# Patient Record
Sex: Female | Born: 1945 | Race: White | Hispanic: No | Marital: Married | Smoking: Never smoker
Health system: Southern US, Community
[De-identification: ages and names within clinical notes are randomized; demographics above are authoritative.]

## PROBLEM LIST (undated history)

## (undated) DIAGNOSIS — E785 Hyperlipidemia, unspecified: Secondary | ICD-10-CM

## (undated) DIAGNOSIS — C801 Malignant (primary) neoplasm, unspecified: Secondary | ICD-10-CM

## (undated) DIAGNOSIS — K219 Gastro-esophageal reflux disease without esophagitis: Secondary | ICD-10-CM

## (undated) HISTORY — DX: Gastro-esophageal reflux disease without esophagitis: K21.9

## (undated) HISTORY — DX: Hyperlipidemia, unspecified: E78.5

## (undated) HISTORY — PX: COLONOSCOPY: SHX174

## (undated) HISTORY — DX: Malignant (primary) neoplasm, unspecified: C80.1

---

## 1975-04-15 HISTORY — PX: KIDNEY SURGERY: SHX687

## 1988-04-14 HISTORY — PX: ABDOMINAL HYSTERECTOMY: SHX81

## 1997-08-17 ENCOUNTER — Other Ambulatory Visit: Admission: RE | Admit: 1997-08-17 | Discharge: 1997-08-17 | Payer: Self-pay | Admitting: Obstetrics and Gynecology

## 2001-01-11 ENCOUNTER — Ambulatory Visit (HOSPITAL_COMMUNITY): Admission: RE | Admit: 2001-01-11 | Discharge: 2001-01-11 | Payer: Self-pay | Admitting: *Deleted

## 2004-02-12 ENCOUNTER — Other Ambulatory Visit: Admission: RE | Admit: 2004-02-12 | Discharge: 2004-02-12 | Payer: Self-pay | Admitting: Obstetrics and Gynecology

## 2005-03-03 ENCOUNTER — Other Ambulatory Visit: Admission: RE | Admit: 2005-03-03 | Discharge: 2005-03-03 | Payer: Self-pay | Admitting: Obstetrics and Gynecology

## 2006-05-04 ENCOUNTER — Other Ambulatory Visit: Admission: RE | Admit: 2006-05-04 | Discharge: 2006-05-04 | Payer: Self-pay | Admitting: Obstetrics and Gynecology

## 2007-05-10 ENCOUNTER — Other Ambulatory Visit: Admission: RE | Admit: 2007-05-10 | Discharge: 2007-05-10 | Payer: Self-pay | Admitting: Obstetrics and Gynecology

## 2008-05-11 ENCOUNTER — Other Ambulatory Visit: Admission: RE | Admit: 2008-05-11 | Discharge: 2008-05-11 | Payer: Self-pay | Admitting: Obstetrics and Gynecology

## 2009-07-18 ENCOUNTER — Other Ambulatory Visit: Admission: RE | Admit: 2009-07-18 | Discharge: 2009-07-18 | Payer: Self-pay | Admitting: Obstetrics and Gynecology

## 2012-11-12 DIAGNOSIS — C801 Malignant (primary) neoplasm, unspecified: Secondary | ICD-10-CM

## 2012-11-12 HISTORY — DX: Malignant (primary) neoplasm, unspecified: C80.1

## 2012-11-12 HISTORY — PX: MELANOMA EXCISION: SHX5266

## 2012-11-22 ENCOUNTER — Other Ambulatory Visit: Payer: Self-pay | Admitting: Dermatology

## 2012-11-23 ENCOUNTER — Other Ambulatory Visit: Payer: Self-pay | Admitting: Dermatology

## 2013-05-05 ENCOUNTER — Ambulatory Visit (INDEPENDENT_AMBULATORY_CARE_PROVIDER_SITE_OTHER): Payer: Medicare Other | Admitting: Nurse Practitioner

## 2013-05-05 ENCOUNTER — Encounter: Payer: Self-pay | Admitting: Nurse Practitioner

## 2013-05-05 VITALS — BP 116/64 | HR 64 | Ht 62.25 in | Wt 146.0 lb

## 2013-05-05 DIAGNOSIS — Z01419 Encounter for gynecological examination (general) (routine) without abnormal findings: Secondary | ICD-10-CM

## 2013-05-05 DIAGNOSIS — Z0000 Encounter for general adult medical examination without abnormal findings: Secondary | ICD-10-CM

## 2013-05-05 LAB — POCT URINALYSIS DIPSTICK
BILIRUBIN UA: NEGATIVE
Blood, UA: NEGATIVE
GLUCOSE UA: NEGATIVE
Ketones, UA: NEGATIVE
Leukocytes, UA: NEGATIVE
NITRITE UA: NEGATIVE
Protein, UA: NEGATIVE
UROBILINOGEN UA: NEGATIVE
pH, UA: 6

## 2013-05-05 NOTE — Patient Instructions (Addendum)

## 2013-05-05 NOTE — Progress Notes (Signed)
Patient ID: Linda Peters, female   DOB: 1946-04-03, 68 y.o.   MRN: [id number] 68 y.o. G78P1001 Married Caucasian Fe here for annual exam.    No LMP recorded. Patient has had a hysterectomy.          Sexually active: no  The current method of family planning is post menopausal status.    Exercising: yes  walking, but not regularly Smoker:  no  Health Maintenance: Pap:  TAH 1990 MMG:  05/18/12, normal Colonoscopy:9/30/ 2002 normal recheck in 10 years BMD:  05/2011, -1.0/-0.8 TDaP:  05/01/11 Shingles vaccine 2011 Pneumovax 2014 Labs:  HB:  PCP Urine: negative   reports that she has never smoked. She has never used smokeless tobacco. She reports that she does not drink alcohol or use illicit drugs.  Past Medical History  Diagnosis Date  . Cancer 11/2012    melanoma, face  . Hyperlipidemia     Past Surgical History  Procedure Laterality Date  . Melanoma excision Right 11/2012    right side of face  . Kidney surgery Right 1977  . Abdominal hysterectomy  1990    secondary to fibroids, ovaries remain    Current Outpatient Prescriptions  Medication Sig Dispense Refill  . Calcium Carbonate (CALCIUM 600 PO) Take 1 tablet by mouth 2 (two) times daily.      . Multiple Vitamin (MULTIVITAMIN) tablet Take 1 tablet by mouth daily.      . simvastatin (ZOCOR) 40 MG tablet Take 40 mg by mouth daily.      . TRANSDERM-SCOP 1.5 MG as directed.      . vitamin D, CHOLECALCIFEROL, 400 UNITS tablet Take 400 Units by mouth daily.       No current facility-administered medications for this visit.    Family History  Problem Relation Age of Onset  . Dementia Mother   . Hyperlipidemia Sister   . Heart failure Maternal Grandmother   . Hyperlipidemia Sister     ROS:  Pertinent items are noted in HPI.  Otherwise, a comprehensive ROS was negative.  Exam:   BP 116/64  Pulse 64  Ht 5' 2.25" (1.581 m)  Wt 146 lb (66.225 kg)  BMI 26.49 kg/m2 Height: 5' 2.25" (158.1 cm)  Ht Readings from Last 3  Encounters:  05/05/13 5' 2.25" (1.581 m)    General appearance: alert, cooperative and appears stated age Head: Normocephalic, without obvious abnormality, atraumatic Neck: no adenopathy, supple, symmetrical, trachea midline and thyroid normal to inspection and palpation Lungs: clear to auscultation bilaterally Breasts: normal appearance, no masses or tenderness Heart: regular rate and rhythm Abdomen: soft, non-tender; no masses,  no organomegaly Extremities: extremities normal, atraumatic, no cyanosis or edema Skin: Skin color, texture, turgor normal. No rashes or lesions Lymph nodes: Cervical, supraclavicular, and axillary nodes normal. 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

## 2013-05-06 NOTE — Progress Notes (Signed)
Encounter reviewed by Dr. Terre Zabriskie Silva.  

## 2013-05-16 ENCOUNTER — Other Ambulatory Visit: Payer: Self-pay | Admitting: Dermatology

## 2014-02-13 ENCOUNTER — Encounter: Payer: Self-pay | Admitting: Nurse Practitioner

## 2014-05-08 ENCOUNTER — Ambulatory Visit: Payer: Medicare Other | Admitting: Nurse Practitioner

## 2014-07-11 ENCOUNTER — Ambulatory Visit (INDEPENDENT_AMBULATORY_CARE_PROVIDER_SITE_OTHER): Payer: Medicare PPO | Admitting: Nurse Practitioner

## 2014-07-11 ENCOUNTER — Encounter: Payer: Self-pay | Admitting: Nurse Practitioner

## 2014-07-11 VITALS — BP 120/84 | HR 68 | Ht 62.0 in | Wt 145.0 lb

## 2014-07-11 DIAGNOSIS — Z1211 Encounter for screening for malignant neoplasm of colon: Secondary | ICD-10-CM

## 2014-07-11 DIAGNOSIS — Z0000 Encounter for general adult medical examination without abnormal findings: Secondary | ICD-10-CM | POA: Diagnosis not present

## 2014-07-11 DIAGNOSIS — Z01419 Encounter for gynecological examination (general) (routine) without abnormal findings: Secondary | ICD-10-CM

## 2014-07-11 LAB — POCT URINALYSIS DIPSTICK
Bilirubin, UA: NEGATIVE
Glucose, UA: NEGATIVE
Ketones, UA: NEGATIVE
Leukocytes, UA: NEGATIVE
Nitrite, UA: NEGATIVE
PH UA: 7
PROTEIN UA: NEGATIVE
RBC UA: NEGATIVE
UROBILINOGEN UA: NEGATIVE

## 2014-07-11 NOTE — Progress Notes (Signed)
Patient ID: Linda Peters, female   DOB: 1945-11-28, 69 y.o.   MRN: [id number] 69 y.o. G92P1001 Married  Caucasian Fe here for annual exam.  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  She is getting Hep B through that facility.  Patient's last menstrual period was 04/14/1988.          Sexually active: No.  The current method of family planning is none.    Exercising: Yes.    patient goes to gym and walks Smoker:  no  Health Maintenance: Pap:  TAH 1990 MMG:  05/20/13, Bi-Rads 1:  Negative [facility]) Colonoscopy:  01/11/01, repeat in 10 years  BMD:   05/2011, T-Score -1.0/-0.8 TDaP:  05/01/11 Shingles: 2011 Prevnar 13: ? Labs:  HB:  PCP  Urine:  Negative    reports that she has never smoked. She has never used smokeless tobacco. She reports that she does not drink alcohol or use illicit drugs.  Past Medical History  Diagnosis Date  . Cancer 11/2012    melanoma, face  . Hyperlipidemia     Past Surgical History  Procedure Laterality Date  . Melanoma excision Right 11/2012    right side of face  . Kidney surgery Right 1977  . Abdominal hysterectomy  1990    secondary to endometrial hyperplasia & fibroids, ovaries remain    Current Outpatient Prescriptions  Medication Sig Dispense Refill  . Calcium Carbonate (CALCIUM 600 PO) Take 1 tablet by mouth 2 (two) times daily.    . Multiple Vitamin (MULTIVITAMIN) tablet Take 1 tablet by mouth daily.    . simvastatin (ZOCOR) 40 MG tablet Take 40 mg by mouth daily.    . vitamin D, CHOLECALCIFEROL, 400 UNITS tablet Take 400 Units by mouth daily.     No current facility-administered medications for this visit.    Family History  Problem Relation Age of Onset  . Dementia Mother   . Hyperlipidemia Sister   . Heart failure Maternal Grandmother   . Hyperlipidemia Sister     ROS:  Pertinent items are noted in HPI.  Otherwise, a comprehensive ROS was negative.  Exam:   BP 120/84 mmHg  Pulse 68  Ht 5\' 2"  (1.575 m)   Wt 145 lb (65.772 kg)  BMI 26.51 kg/m2  LMP 04/14/1988 Height: 5\' 2"  (157.5 cm) Ht Readings from Last 3 Encounters:  07/11/14 5\' 2"  (1.575 m)  05/05/13 5' 2.25" (1.581 m)    General appearance: alert, cooperative and appears stated age Head: Normocephalic, without obvious abnormality, atraumatic Neck: no adenopathy, supple, symmetrical, trachea midline and thyroid normal to inspection and palpation Lungs: clear to auscultation bilaterally Breasts: normal appearance, no masses or tenderness Heart: regular rate and rhythm Abdomen: soft, non-tender; no masses,  no organomegaly Extremities: extremities normal, atraumatic, no cyanosis or edema Skin: Skin color, texture, turgor normal. No rashes or lesions Lymph nodes: Cervical, supraclavicular, and axillary nodes normal. No abnormal inguinal nodes palpated Neurologic: Grossly normal   Pelvic: External genitalia:  no lesions              Urethra:  normal appearing urethra with no masses, tenderness or lesions              Bartholin's and Skene's: normal                 Vagina: normal appearing vagina with normal color and discharge, no lesions              Cervix:  absent              Pap taken: No. 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

## 2014-07-11 NOTE — Patient Instructions (Addendum)

## 2014-07-12 NOTE — Progress Notes (Signed)
Encounter reviewed by Dr. Dellene Mcgroarty Silva.  

## 2014-08-14 LAB — FECAL OCCULT BLOOD, IMMUNOCHEMICAL: IMMUNOLOGICAL FECAL OCCULT BLOOD TEST: NEGATIVE

## 2014-08-14 NOTE — Addendum Note (Signed)
Addended by: Susy Manor on: 08/14/2014 01:00 PM   Modules accepted: Orders

## 2014-10-03 ENCOUNTER — Encounter: Payer: Self-pay | Admitting: Emergency Medicine

## 2014-10-03 ENCOUNTER — Telehealth: Payer: Self-pay | Admitting: Emergency Medicine

## 2014-10-03 NOTE — Telephone Encounter (Signed)
Call to patient. She has not made appointment for Gastroenterology at [place]. Patient was contacted x 3 and also a letter was mailed to her home address.  Spoke with patient. She states she has been "putting this off" and expresses that she does not wish to schedule at this time.  Advised that [facility] has her information for when she would like to schedule when she is ready to do so. Patient also agrees that she has their contact information and will call them when she is ready.   Routing to provider for update and will close encounter.

## 2014-10-09 ENCOUNTER — Other Ambulatory Visit: Payer: Self-pay

## 2014-10-26 ENCOUNTER — Encounter: Payer: Self-pay | Admitting: *Deleted

## 2014-10-26 ENCOUNTER — Telehealth: Payer: Self-pay | Admitting: Nurse Practitioner

## 2014-10-26 NOTE — Telephone Encounter (Signed)
Letter to your office for review. 

## 2014-10-26 NOTE — Telephone Encounter (Signed)
Patient says she need a referral for a colonoscopy faxed to [facility] in [place]. Fax number is [phone].

## 2014-10-27 NOTE — Telephone Encounter (Signed)
Left message to call Rai Severns at [phone]. 

## 2014-10-27 NOTE — Telephone Encounter (Signed)
Spoke with patient. Notified patient referral letter was written and signed by Milford Cage, [place]. Has been faxed with cover sheet and confirmation to [facility] in [place] at [phone]. Patient is agreeable.  Routing to provider for final review. Patient agreeable to disposition. Will close encounter.

## 2014-10-30 ENCOUNTER — Telehealth: Payer: Self-pay | Admitting: Nurse Practitioner

## 2014-10-30 NOTE — Telephone Encounter (Signed)
Spoke with Linda Peters who states that patient will need referral from her PCP with her insurance. Spoke with patient advised referral will need to come from her PCP. Patient is agreeable and will call her PCP for referral to schedule appointment. Will let us know if she needs anything further.  Routing to provider for final review. Patient agreeable to disposition. Will close encounter.   Patient aware provider will review message and nurse will return call if any additional advice or change of disposition.

## 2014-10-30 NOTE — Telephone Encounter (Signed)
Patient calling to check on status of referral for a colonoscopy. She says the office did not receive the referral.  Facility in [place]: Fax: [phone], Phone: [phone]

## 2015-01-18 LAB — HM COLONOSCOPY

## 2015-07-13 ENCOUNTER — Ambulatory Visit: Payer: Medicare PPO | Admitting: Nurse Practitioner

## 2015-07-17 ENCOUNTER — Ambulatory Visit (INDEPENDENT_AMBULATORY_CARE_PROVIDER_SITE_OTHER): Payer: Medicare Other | Admitting: Nurse Practitioner

## 2015-07-17 ENCOUNTER — Encounter: Payer: Self-pay | Admitting: Nurse Practitioner

## 2015-07-17 VITALS — BP 110/74 | HR 64 | Ht 62.25 in | Wt 144.0 lb

## 2015-07-17 DIAGNOSIS — E559 Vitamin D deficiency, unspecified: Secondary | ICD-10-CM | POA: Diagnosis not present

## 2015-07-17 DIAGNOSIS — Z803 Family history of malignant neoplasm of breast: Secondary | ICD-10-CM | POA: Diagnosis not present

## 2015-07-17 DIAGNOSIS — Z0000 Encounter for general adult medical examination without abnormal findings: Secondary | ICD-10-CM | POA: Diagnosis not present

## 2015-07-17 DIAGNOSIS — Z01419 Encounter for gynecological examination (general) (routine) without abnormal findings: Secondary | ICD-10-CM

## 2015-07-17 LAB — POCT URINALYSIS DIPSTICK
Bilirubin, UA: NEGATIVE
Blood, UA: NEGATIVE
GLUCOSE UA: NEGATIVE
KETONES UA: NEGATIVE
Leukocytes, UA: NEGATIVE
Nitrite, UA: NEGATIVE
PROTEIN UA: NEGATIVE
Urobilinogen, UA: NEGATIVE
pH, UA: 7

## 2015-07-17 NOTE — Progress Notes (Signed)
Patient ID: Linda Peters, female   DOB: Jul 29, 1945, 70 y.o.   MRN: [id number]  70 y.o. G61P1001 Married Caucasian Fe here for annual exam.  No new diagnosis or surgery. Sister age 85 now diagnosed with breast cancer treated with lumpectomy X 4 areas -   Stage 0.  Will get radiation treatment.  Will check on sisters BRCA status.  She continues working with Hospice.     Patient's last menstrual period was 04/14/1988 (approximate).          Sexually active: Yes.    The current method of family planning is status post hysterectomy.    Exercising: Yes.    Gym/ health club routine includes exercise class 2-3 days per week. Smoker:  no  Health Maintenance: Pap: TAH 1990 MMG:07/13/14, Bi-Rads 1: Negative [facility]) Colonoscopy: 01/18/15, polyp (non-cancerous per letter), repeat in 5 years BMD:07/26/14 T Score; -1.4 Spine / -0.7 Left Femur Neck TDaP: 05/01/11 Shingles: 2011 Prevnar 13: 07/2014, Pneumovax 2015 Hep C: done today Labs: Dr. Unk Lightning  Urine: Negative    reports that she has never smoked. She has never used smokeless tobacco. She reports that she does not drink alcohol or use illicit drugs.  Past Medical History  Diagnosis Date  . Cancer ([place]) 11/2012    melanoma, face  . Hyperlipidemia     Past Surgical History  Procedure Laterality Date  . Melanoma excision Right 11/2012    right side of face  . Kidney surgery Right 1977  . Abdominal hysterectomy  1990    secondary to endometrial hyperplasia & fibroids, ovaries remain    Current Outpatient Prescriptions  Medication Sig Dispense Refill  . Calcium Carbonate (CALCIUM 600 PO) Take 1 tablet by mouth 2 (two) times daily.    . Multiple Vitamin (MULTIVITAMIN) tablet Take 1 tablet by mouth daily.    . simvastatin (ZOCOR) 40 MG tablet Take 40 mg by mouth daily.    . vitamin D, CHOLECALCIFEROL, 400 UNITS tablet Take 400 Units by mouth daily.     No current facility-administered medications for this visit.    Family  History  Problem Relation Age of Onset  . Dementia Mother   . Hyperlipidemia Sister   . Breast cancer Sister 34    lumpectomy and radiation, left  . Heart failure Maternal Grandmother   . Hyperlipidemia Sister   . Pancreatic cancer Cousin 80    surgrey 2017 - still living    ROS:  Pertinent items are noted in HPI.  Otherwise, a comprehensive ROS was negative.  Exam:   BP 110/74 mmHg  Pulse 64  Ht 5' 2.25" (1.581 m)  Wt 144 lb (65.318 kg)  BMI 26.13 kg/m2  LMP 04/14/1988 (Approximate) Height: 5' 2.25" (158.1 cm) Ht Readings from Last 3 Encounters:  07/17/15 5' 2.25" (1.581 m)  07/11/14 _0  (1.575 m)  05/05/13 5' 2.25" (1.581 m)    General appearance: alert, cooperative and appears stated age Head: Normocephalic, without obvious abnormality, atraumatic Neck: no adenopathy, supple, symmetrical, trachea midline and thyroid normal to inspection and palpation Lungs: clear to auscultation bilaterally Breasts: normal appearance, no masses or tenderness Heart: regular rate and rhythm Abdomen: soft, non-tender; no masses,  no organomegaly Extremities: extremities normal, atraumatic, no cyanosis or edema Skin: Skin color, texture, turgor normal. No rashes or lesions Lymph nodes: Cervical, supraclavicular, and axillary nodes normal. No abnormal inguinal nodes palpated Neurologic: Grossly normal   Pelvic: External genitalia:  no lesions  Urethra:  normal appearing urethra with no masses, tenderness or lesions              Bartholin's and Skene's: normal                 Vagina: normal appearing vagina with normal color and discharge, no lesions              Cervix: absent              Pap taken: No. Bimanual Exam:  Uterus:  uterus absent              Adnexa: no mass, fullness, tenderness               Rectovaginal: Confirms               Anus:  normal sphincter tone, no lesions  Chaperone present: no  A:  Well Woman with normal exam  S/P TAH secondary to  endometrial hyperplasia & fibroids 1990 no HRT Hypercholesterolemia  Vit D deficiency   P:   Reviewed health and wellness pertinent to exam  Pap smear as above  Mammogram is due now and [facility] diagnosis.    She is given information about BRCA and testing  Will follow with labs  Counseled on breast self exam, mammography screening, adequate intake of calcium and vitamin D, diet and exercise, Kegel's exercises return annually or prn  An After Visit Summary was printed and given to the patient.

## 2015-07-17 NOTE — Patient Instructions (Addendum)

## 2015-07-18 LAB — HEPATITIS C ANTIBODY: HCV AB: NEGATIVE

## 2015-07-18 LAB — VITAMIN D 25 HYDROXY (VIT D DEFICIENCY, FRACTURES): VIT D 25 HYDROXY: 50 ng/mL (ref 30–100)

## 2015-07-22 NOTE — Progress Notes (Signed)
Reviewed personally.  M. Suzanne Kynnedi Zweig, MD.  

## 2015-07-23 ENCOUNTER — Other Ambulatory Visit: Payer: Self-pay

## 2015-07-23 DIAGNOSIS — Z1231 Encounter for screening mammogram for malignant neoplasm of breast: Secondary | ICD-10-CM

## 2015-08-07 ENCOUNTER — Ambulatory Visit
Admission: RE | Admit: 2015-08-07 | Discharge: 2015-08-07 | Disposition: A | Discharge status: Home / Self Care | Payer: Medicare Other | Source: Ambulatory Visit

## 2015-08-07 DIAGNOSIS — Z1231 Encounter for screening mammogram for malignant neoplasm of breast: Secondary | ICD-10-CM

## 2015-08-10 ENCOUNTER — Other Ambulatory Visit: Payer: Self-pay | Admitting: Nurse Practitioner

## 2015-08-10 DIAGNOSIS — R928 Other abnormal and inconclusive findings on diagnostic imaging of breast: Secondary | ICD-10-CM

## 2015-08-17 ENCOUNTER — Ambulatory Visit
Admission: RE | Admit: 2015-08-17 | Discharge: 2015-08-17 | Disposition: A | Discharge status: Home / Self Care | Payer: Medicare Other | Source: Ambulatory Visit | Attending: Nurse Practitioner | Admitting: Nurse Practitioner

## 2015-08-17 DIAGNOSIS — R928 Other abnormal and inconclusive findings on diagnostic imaging of breast: Secondary | ICD-10-CM

## 2016-07-22 ENCOUNTER — Ambulatory Visit (INDEPENDENT_AMBULATORY_CARE_PROVIDER_SITE_OTHER): Payer: Medicare Other | Admitting: Nurse Practitioner

## 2016-07-22 ENCOUNTER — Encounter: Payer: Self-pay | Admitting: Nurse Practitioner

## 2016-07-22 VITALS — BP 120/78 | HR 64 | Ht 61.75 in | Wt 145.0 lb

## 2016-07-22 DIAGNOSIS — Z0000 Encounter for general adult medical examination without abnormal findings: Secondary | ICD-10-CM

## 2016-07-22 DIAGNOSIS — Z01411 Encounter for gynecological examination (general) (routine) with abnormal findings: Secondary | ICD-10-CM | POA: Diagnosis not present

## 2016-07-22 DIAGNOSIS — Z803 Family history of malignant neoplasm of breast: Secondary | ICD-10-CM

## 2016-07-22 DIAGNOSIS — E2839 Other primary ovarian failure: Secondary | ICD-10-CM

## 2016-07-22 DIAGNOSIS — E559 Vitamin D deficiency, unspecified: Secondary | ICD-10-CM

## 2016-07-22 NOTE — Progress Notes (Signed)
Patient ID: Linda Peters, female   DOB: April 28, 1945, 71 y.o.   MRN: [id number]  71 y.o. G7P1001 Married  Caucasian Fe here for annual exam.  Now not substituting.  No new problems since last year.  Patient's last menstrual period was 04/14/1988 (approximate).          Sexually active: No.  The current method of family planning is abstinence and status post hysterectomy.    Exercising: Yes.    Gym/ health club routine includes aerobics 2-3 mornings per week and walking. Smoker:  no  Health Maintenance: Pap: TAH 1990 MMG:08/07/15, 3D with additional views of left breast 08/17/15; Bi-Rads 1:  Negative Colonoscopy:01/18/15, polyp (non-cancerous per letter), repeat in 5 years ? Will check with GI Dr. Lyndel Safe. BMD:07/26/14 T Score; -1.4 Spine / -0.7 Left Femur Neck (right hip not measured) TDaP: 05/01/11 Shingles: 2011 Pneumonia: Prevnar 13: 07/2014, Pneumovax 2015 Hep C: 07/17/15 Labs: PCP is taking care of all labs   reports that she has never smoked. She has never used smokeless tobacco. She reports that she does not drink alcohol or use drugs.  Past Medical History:  Diagnosis Date  . Cancer ([place]) 11/2012   melanoma, face  . Hyperlipidemia     Past Surgical History:  Procedure Laterality Date  . ABDOMINAL HYSTERECTOMY  1990   secondary to endometrial hyperplasia & fibroids, ovaries remain  . KIDNEY SURGERY Right 1977  . MELANOMA EXCISION Right 11/2012   right side of face    Current Outpatient Prescriptions  Medication Sig Dispense Refill  . Calcium Carbonate (CALCIUM 600 PO) Take 1 tablet by mouth 2 (two) times daily.    . Multiple Vitamin (MULTIVITAMIN) tablet Take 1 tablet by mouth daily.    . simvastatin (ZOCOR) 20 MG tablet Take 20 mg by mouth daily.    . vitamin D, CHOLECALCIFEROL, 400 UNITS tablet Take 400 Units by mouth daily.     No current facility-administered medications for this visit.     Family History  Problem Relation Age of Onset  . Dementia Mother   .  Hyperlipidemia Sister   . Breast cancer Sister 62    .left lumpectomy with radiaton, BRCA neg.  Marland Kitchen Heart failure Maternal Grandmother   . Hyperlipidemia Sister   . Pancreatic cancer Cousin 80    surgrey 2017 - still living    ROS:  Pertinent items are noted in HPI.  Otherwise, a comprehensive ROS was negative.  Exam:   BP 120/78 (BP Location: Right Arm, Patient Position: Sitting, Cuff Size: Normal)   Pulse 64   Ht 5' 1.75" (1.568 m)   Wt 145 lb (65.8 kg)   LMP 04/14/1988 (Approximate)   BMI 26.74 kg/m  Height: 5' 1.75" (156.8 cm) Ht Readings from Last 3 Encounters:  07/22/16 5' 1.75" (1.568 m)  07/17/15 5' 2.25" (1.581 m)  07/11/14 '5\' 2"'  (1.575 m)    General appearance: alert, cooperative and appears stated age Head: Normocephalic, without obvious abnormality, atraumatic Neck: no adenopathy, supple, symmetrical, trachea midline and thyroid normal to inspection and palpation Lungs: clear to auscultation bilaterally Breasts: normal appearance, no masses or tenderness Heart: regular rate and rhythm Abdomen: soft, non-tender; no masses,  no organomegaly Extremities: extremities normal, atraumatic, no cyanosis or edema Skin: Skin color, texture, turgor normal. No rashes or lesions Lymph nodes: Cervical, supraclavicular, and axillary nodes normal. No abnormal inguinal nodes palpated Neurologic: Grossly normal   Pelvic: External genitalia:  no lesions  Urethra:  normal appearing urethra with no masses, tenderness or lesions              Bartholin's and Skene's: normal                 Vagina: normal appearing vagina with normal color and discharge, no lesions              Cervix: absent              Pap taken: No. Bimanual Exam:  Uterus:  uterus absent              Adnexa: no mass, fullness, tenderness               Rectovaginal: Confirms               Anus:  normal sphincter tone, no lesions  Chaperone present: yes  A:  Well Woman with normal exam   S/P TAH  secondary to endometrial hyperplasia & fibroids 1990 no HRT Hypercholesterolemia             Vit D deficiency    P:   Reviewed health and wellness pertinent to exam  Pap smear as above  Mammogram is due 07/2016 and will get BMD - order is placed   Follow with Vit D - currently on OTC  Counseled on breast self exam, mammography screening, adequate intake of calcium and vitamin D, diet and exercise return annually or prn  An After Visit Summary was printed and given to the patient.

## 2016-07-22 NOTE — Patient Instructions (Signed)

## 2016-07-23 ENCOUNTER — Other Ambulatory Visit: Payer: Self-pay | Admitting: Nurse Practitioner

## 2016-07-23 DIAGNOSIS — Z1231 Encounter for screening mammogram for malignant neoplasm of breast: Secondary | ICD-10-CM

## 2016-07-23 LAB — VITAMIN D 25 HYDROXY (VIT D DEFICIENCY, FRACTURES): Vit D, 25-Hydroxy: 43 ng/mL (ref 30–100)

## 2016-07-24 NOTE — Progress Notes (Signed)
Encounter reviewed by Dr. Jaklyn Alen Amundson C. Silva.  

## 2016-08-15 ENCOUNTER — Ambulatory Visit
Admission: RE | Admit: 2016-08-15 | Discharge: 2016-08-15 | Disposition: A | Discharge status: Home / Self Care | Payer: Medicare Other | Source: Ambulatory Visit | Attending: Nurse Practitioner | Admitting: Nurse Practitioner

## 2016-08-15 DIAGNOSIS — E2839 Other primary ovarian failure: Secondary | ICD-10-CM

## 2016-08-15 DIAGNOSIS — Z1231 Encounter for screening mammogram for malignant neoplasm of breast: Secondary | ICD-10-CM

## 2016-08-18 ENCOUNTER — Other Ambulatory Visit: Payer: Self-pay | Admitting: Nurse Practitioner

## 2016-08-18 DIAGNOSIS — R928 Other abnormal and inconclusive findings on diagnostic imaging of breast: Secondary | ICD-10-CM

## 2016-08-21 ENCOUNTER — Ambulatory Visit
Admission: RE | Admit: 2016-08-21 | Discharge: 2016-08-21 | Disposition: A | Discharge status: Home / Self Care | Payer: Medicare Other | Source: Ambulatory Visit | Attending: Nurse Practitioner | Admitting: Nurse Practitioner

## 2016-08-21 DIAGNOSIS — R928 Other abnormal and inconclusive findings on diagnostic imaging of breast: Secondary | ICD-10-CM

## 2016-11-07 ENCOUNTER — Telehealth: Payer: Self-pay | Admitting: Obstetrics and Gynecology

## 2016-11-07 NOTE — Telephone Encounter (Signed)
Left message for patient to reschedule Patty Grubb appt. °

## 2017-07-23 ENCOUNTER — Other Ambulatory Visit: Payer: Self-pay | Admitting: Certified Nurse Midwife

## 2017-07-23 DIAGNOSIS — Z1231 Encounter for screening mammogram for malignant neoplasm of breast: Secondary | ICD-10-CM

## 2017-07-24 ENCOUNTER — Ambulatory Visit: Payer: Medicare Other | Admitting: Nurse Practitioner

## 2017-09-03 ENCOUNTER — Other Ambulatory Visit: Payer: Self-pay

## 2017-09-03 ENCOUNTER — Encounter: Payer: Self-pay | Admitting: Certified Nurse Midwife

## 2017-09-03 ENCOUNTER — Ambulatory Visit
Admission: RE | Admit: 2017-09-03 | Discharge: 2017-09-03 | Disposition: A | Discharge status: Home / Self Care | Payer: Medicare Other | Source: Ambulatory Visit | Attending: Certified Nurse Midwife | Admitting: Certified Nurse Midwife

## 2017-09-03 ENCOUNTER — Ambulatory Visit: Payer: Medicare Other

## 2017-09-03 ENCOUNTER — Ambulatory Visit: Payer: Medicare Other | Admitting: Obstetrics and Gynecology

## 2017-09-03 ENCOUNTER — Ambulatory Visit (INDEPENDENT_AMBULATORY_CARE_PROVIDER_SITE_OTHER): Payer: Medicare Other | Admitting: Certified Nurse Midwife

## 2017-09-03 VITALS — BP 106/62 | HR 68 | Resp 16 | Ht 61.75 in | Wt 147.0 lb

## 2017-09-03 DIAGNOSIS — Z01419 Encounter for gynecological examination (general) (routine) without abnormal findings: Secondary | ICD-10-CM

## 2017-09-03 DIAGNOSIS — Z0000 Encounter for general adult medical examination without abnormal findings: Secondary | ICD-10-CM

## 2017-09-03 DIAGNOSIS — M8588 Other specified disorders of bone density and structure, other site: Secondary | ICD-10-CM

## 2017-09-03 DIAGNOSIS — Z1231 Encounter for screening mammogram for malignant neoplasm of breast: Secondary | ICD-10-CM

## 2017-09-03 DIAGNOSIS — N951 Menopausal and female climacteric states: Secondary | ICD-10-CM

## 2017-09-03 LAB — POCT URINALYSIS DIPSTICK
Bilirubin, UA: NEGATIVE
Blood, UA: NEGATIVE
Glucose, UA: NEGATIVE
KETONES UA: NEGATIVE
LEUKOCYTES UA: NEGATIVE
NITRITE UA: NEGATIVE
PH UA: 5 (ref 5.0–8.0)
PROTEIN UA: NEGATIVE
UROBILINOGEN UA: NEGATIVE U/dL — AB

## 2017-09-03 NOTE — Patient Instructions (Signed)

## 2017-09-03 NOTE — Progress Notes (Signed)
72 y.o. G34P1001 Married  Caucasian Fe here for annual exam. Menopausal no HRT. Denies vaginal bleeding or vaginal dryness. Sees PCP Dr. Unk Lightning for aex and labs except for Vitamin D done here. Continues on cholesterol medication without issues. Has noted small red spot on vulva and would like checked. Denies itching, burning or pain in area. Stays active with [facility] and at [facility]. No other health issues today.  Patient's last menstrual period was 04/14/1988 (approximate).          Sexually active: No.  The current method of family planning is tubal ligation.    Exercising: Yes.    walking & aerobics Smoker:  no  Health Maintenance: Pap:  1990? S/P TAH for fibroids/hyperplasia, no cancer History of Abnormal Pap: no MMG:  Bilateral & left breast 5/18 category c density birads 1:neg Self Breast exams: no Colonoscopy:  2016 polyp, f/u 73yr BMD:   2018 slight low bone mass on calcium TDaP:  2013 Shingles: 2011 Pneumonia: 2016 Hep C and HIV: hep c neg 2017 Labs:  If needed   reports that she has never smoked. She has never used smokeless tobacco. She reports that she does not drink alcohol or use drugs.  Past Medical History:  Diagnosis Date  . [place]   melanoma, face  . Hyperlipidemia     Past Surgical History:  Procedure Laterality Date  . ABDOMINAL HYSTERECTOMY  1990   secondary to endometrial hyperplasia & fibroids, ovaries remain  . KIDNEY SURGERY Right 1977  . MELANOMA EXCISION Right 11/2012   right side of face    Current Outpatient Medications  Medication Sig Dispense Refill  . Calcium Carbonate (CALCIUM 600 PO) Take 1 tablet by mouth 2 (two) times daily.    . Multiple Vitamin (MULTIVITAMIN) tablet Take 1 tablet by mouth daily.    . simvastatin (ZOCOR) 20 MG tablet Take 20 mg by mouth daily.    . vitamin D, CHOLECALCIFEROL, 400 UNITS tablet Take 400 Units by mouth daily.     No current facility-administered medications for this visit.     Family  History  Problem Relation Age of Onset  . Dementia Mother   . Hyperlipidemia Sister   . Breast cancer Sister 679      .left lumpectomy with radiaton, BRCA neg.  .Marland KitchenHeart failure Maternal Grandmother   . Hyperlipidemia Sister   . Pancreatic cancer Cousin 80       surgrey 2017 - still living    ROS:  Pertinent items are noted in HPI.  Otherwise, a comprehensive ROS was negative.  Exam:   BP 106/62   Pulse 68   Resp 16   Ht 5' 1.75" (1.568 m)   Wt 147 lb (66.7 kg)   LMP 04/14/1988 (Approximate)   BMI 27.10 kg/m  Height: 5' 1.75" (156.8 cm) Ht Readings from Last 3 Encounters:  09/03/17 5' 1.75" (1.568 m)  07/22/16 5' 1.75" (1.568 m)  07/17/15 5' 2.25" (1.581 m)    General appearance: alert, cooperative and appears stated age Head: Normocephalic, without obvious abnormality, atraumatic Neck: no adenopathy, supple, symmetrical, trachea midline and thyroid normal to inspection and palpation Lungs: clear to auscultation bilaterally Breasts: normal appearance, no masses or tenderness, No nipple retraction or dimpling, No nipple discharge or bleeding, No axillary or supraclavicular adenopathy Heart: regular rate and rhythm Abdomen: soft, non-tender; no masses,  no organomegaly Extremities: extremities normal, atraumatic, no cyanosis or edema Skin: Skin color, texture, turgor normal. No rashes or  lesions Lymph nodes: Cervical, supraclavicular, and axillary nodes normal. No abnormal inguinal nodes palpated Neurologic: Grossly normal   Pelvic: External genitalia:  no lesions, atrophic appearance, small hemangioma noted on left vulva at bottom, non tender, no unusual appearance              Urethra:  normal appearing urethra with no masses, tenderness or lesions              Bartholin's and Skene's: normal                 Vagina: normal appearing vagina with normal color and discharge, no lesions              Cervix: absent              Pap taken: No. Bimanual Exam:  Uterus:  uterus  absent              Adnexa: normal adnexa and no mass, fullness, tenderness               Rectovaginal: Confirms               Anus:  normal sphincter tone, no lesions  Chaperone present: yes  A:  Well Woman with normal exam  Post menopausal no HRT S/P TAH for fibroids, ovaries retained  Cholesterol medication management with PCP  History of Osteopenia, ? 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  Lab: Vitamin D  Discussed Vulva benign finding of hemangioma and etiology. Questions addressed.  Pap smear: no   counseled on breast self exam, mammography screening, feminine hygiene, adequate intake of calcium and vitamin D, diet and exercise  return annually or prn  An After Visit Summary was printed and given to the patient.

## 2017-09-04 LAB — VITAMIN D 25 HYDROXY (VIT D DEFICIENCY, FRACTURES): Vit D, 25-Hydroxy: 54.6 ng/mL (ref 30.0–100.0)

## 2018-06-17 ENCOUNTER — Encounter: Payer: Self-pay | Admitting: Sports Medicine

## 2018-06-17 ENCOUNTER — Ambulatory Visit: Payer: Medicare Other | Admitting: Sports Medicine

## 2018-06-17 VITALS — BP 124/71 | HR 70 | Resp 16 | Ht 61.0 in | Wt 141.0 lb

## 2018-06-17 DIAGNOSIS — L600 Ingrowing nail: Secondary | ICD-10-CM

## 2018-06-17 DIAGNOSIS — M79675 Pain in left toe(s): Secondary | ICD-10-CM | POA: Diagnosis not present

## 2018-06-17 DIAGNOSIS — B351 Tinea unguium: Secondary | ICD-10-CM

## 2018-06-17 DIAGNOSIS — M79674 Pain in right toe(s): Secondary | ICD-10-CM

## 2018-06-17 NOTE — Progress Notes (Signed)
Subjective: Linda Peters is a 73 y.o. female patient seen today in office with complaint of mildly painful thickened and discolored nails especially big toes and some tenderness sometimes to the lateral corners that is a dull pain 4 out of 10 over the last 4 to 5 months.  Reports that pain at toes is worse with shoes and is wondering if her vitamins are making her toenails thick.  Patient is desiring treatment for nail changes; has tried OTC topicals/Medication of red oil in the past with no improvement. Reports that nails are becoming difficult to manage because of the thickness. Patient has no other pedal complaints at this time.   Review of Systems  All other systems reviewed and are negative.    There are no active problems to display for this patient.   Current Outpatient Medications on File Prior to Visit  Medication Sig Dispense Refill  . Calcium Carbonate (CALCIUM 600 PO) Take 1 tablet by mouth 2 (two) times daily.    . Multiple Vitamin (MULTIVITAMIN) tablet Take 1 tablet by mouth daily.    . simvastatin (ZOCOR) 20 MG tablet Take 20 mg by mouth daily.    . vitamin D, CHOLECALCIFEROL, 400 UNITS tablet Take 400 Units by mouth daily.     No current facility-administered medications on file prior to visit.     Allergies  Allergen Reactions  . Penicillins Rash    Objective: Physical Exam  General: Well developed, nourished, no acute distress, awake, alert and oriented x 3  Vascular: Dorsalis pedis artery 1/4 bilateral, Posterior tibial artery 1/4 bilateral, skin temperature warm to warm proximal to distal bilateral lower extremities, no varicosities, pedal hair present bilateral.  Neurological: Gross sensation present via light touch bilateral.   Dermatological: Skin is warm, dry, and supple bilateral, Nails 1-10 are tender, short thick, and discolored with mild subungal debris with the bilateral hallux nails most involved and mild incurvation lateral borders without signs of  infection, no webspace macerations present bilateral, no open lesions present bilateral, no callus/corns/hyperkeratotic tissue present bilateral. No signs of infection bilateral.  Musculoskeletal: Asymptomatic hammertoe boney deformities noted bilateral. Muscular strength within normal limits without pain on range of motion. No pain with calf compression bilateral.  Assessment and Plan:  Problem List Items Addressed This Visit    None    Visit Diagnoses    Nail fungus    -  Primary   Relevant Orders   Culture, fungus without smear   Ingrowing nail       Toe pain, bilateral          -Examined patient -Discussed treatment options for painful dystrophic nails with ingrowing that is not acute or infected -Patient elects at this time for fungal culture -Fungal culture was obtained by removing a portion of the hard nail itself from each toes 1 through 10 with the most nail samples taken from bilateral hallux of the involved toenails using a sterile nail nipper and sent to [facility] lab. Patient tolerated the biopsy procedure well without discomfort or need for anesthesia.  -Patient to return in 4 weeks for follow up evaluation and discussion of fungal culture results or sooner if symptoms worsen.  Landis Martins, DPM

## 2018-06-17 NOTE — Progress Notes (Signed)
   Subjective:    Patient ID: Linda Peters, female    DOB: [birth year], 73 y.o.   MRN: [id number]  HPI    Review of Systems  All other systems reviewed and are negative.      Objective:   Physical Exam        Assessment & Plan:

## 2018-07-12 ENCOUNTER — Telehealth: Payer: Self-pay | Admitting: *Deleted

## 2018-07-12 DIAGNOSIS — Z79899 Other long term (current) drug therapy: Secondary | ICD-10-CM

## 2018-07-12 DIAGNOSIS — B351 Tinea unguium: Secondary | ICD-10-CM

## 2018-07-12 NOTE — Telephone Encounter (Signed)
Cancelled appt due to Covid19 protocol.  Patient amendable to a phone call with results.   Results scanned in to system already

## 2018-07-12 NOTE — Telephone Encounter (Signed)
Patient Fungal culture + for Mold Treatment options: 1. Lamisil 2. Laser 3. Topical Please see what option the patient decides to do at this time and send Rx or LFTs order Thanks Dr. Chauncey Cruel

## 2018-07-13 NOTE — Telephone Encounter (Signed)
I informed pt and her husband of the results and the recommendations. Pt's husband states they will think about it and call back.

## 2018-07-14 NOTE — Telephone Encounter (Signed)
I called pt, she asked why the liver test was performed. I told pt the liver test was performed prior to prescribing the lamisil to make sure the liver was healthy. Pt asked if lamisil affected the liver and I told her there was a possibility. I told pt that if lab results were abnormal she could choose not to take lamisil. Pt states she would call again.

## 2018-07-14 NOTE — Telephone Encounter (Signed)
Pt called stating she had more questions concerning her result.

## 2018-07-15 ENCOUNTER — Ambulatory Visit: Payer: Medicare Other | Admitting: Sports Medicine

## 2018-07-20 MED ORDER — NONFORMULARY OR COMPOUNDED ITEM: 11 refills | Status: DC

## 2018-07-20 NOTE — Addendum Note (Signed)
Addended by: Harriett Sine D on: 07/20/2018 11:47 AM   Modules accepted: Orders

## 2018-07-20 NOTE — Telephone Encounter (Signed)
Mailed labs to pt with handwritten note stating the paper orders were reminders the labs would be in [employer].

## 2018-07-20 NOTE — Telephone Encounter (Signed)
I called pt and she states she would like to do the lamisil and the topical. Pt asked if [facility] would still draw blood and I told pt is would have the [place] contact [facility] for their rules due to the COVID-19 pandemic and give her a call. I told pt the cost of the antifungal topical is about $38.00, and [place] [phone] would call with the coverage and delivery information.

## 2018-07-20 NOTE — Telephone Encounter (Signed)
Pt called to inform the nurse that she is interested in starting the Lamisil for treatment but has a couple of questions. Pt also states she is interested in the compound cream but would like to know what the cost will be on the cream before having it prescribed. Please give patient a call.

## 2018-07-30 ENCOUNTER — Other Ambulatory Visit: Payer: Self-pay | Admitting: Sports Medicine

## 2018-07-30 LAB — CBC WITH DIFFERENTIAL/PLATELET
Basophils Absolute: 0 10*3/uL (ref 0.0–0.2)
Basos: 0 %
EOS (ABSOLUTE): 0.1 10*3/uL (ref 0.0–0.4)
Eos: 3 %
Hematocrit: 41 % (ref 34.0–46.6)
Hemoglobin: 13.8 g/dL (ref 11.1–15.9)
Lymphocytes Absolute: 2.2 10*3/uL (ref 0.7–3.1)
Lymphs: 43 %
MCH: 29.8 pg (ref 26.6–33.0)
MCHC: 33.7 g/dL (ref 31.5–35.7)
MCV: 89 fL (ref 79–97)
Monocytes Absolute: 0.6 10*3/uL (ref 0.1–0.9)
Monocytes: 12 %
Neutrophils Absolute: 2.2 10*3/uL (ref 1.4–7.0)
Neutrophils: 42 %
Platelets: 167 10*3/uL (ref 150–450)
RBC: 4.63 x10E6/uL (ref 3.77–5.28)
RDW: 13.5 % (ref 11.7–15.4)
WBC: 5.2 10*3/uL (ref 3.4–10.8)

## 2018-07-30 LAB — HEPATIC FUNCTION PANEL
ALT: 22 IU/L (ref 0–32)
AST: 28 IU/L (ref 0–40)
Albumin: 4.7 g/dL (ref 3.8–4.8)
Alkaline Phosphatase: 73 IU/L (ref 39–117)
Bilirubin Total: 0.6 mg/dL (ref 0.0–1.2)
Bilirubin, Direct: 0.14 mg/dL (ref 0.00–0.40)
Total Protein: 6.4 g/dL (ref 6.0–8.5)

## 2018-08-02 ENCOUNTER — Telehealth: Payer: Self-pay | Admitting: *Deleted

## 2018-08-02 MED ORDER — TERBINAFINE HCL 250 MG PO TABS: 250.0000 mg | ORAL_TABLET | Freq: Every day | ORAL | 0 refills | Status: DC

## 2018-08-02 NOTE — Telephone Encounter (Signed)
Pt called and stated Dr. Cannon Kettle told her to file not cut her nails, but her appt in April was cancelled, so how does she take care of her nails. I told pt that Dr. Cannon Kettle had instructed her to file the toenails then that is how she should take care of them when they get long.

## 2018-08-02 NOTE — Telephone Encounter (Signed)
-----   Message from Landis Martins, [place] sent at 07/31/2018  9:23 AM EDT ----- LFTs are normal. Send Lamisil 250mg  daily x 90 days to pharmacy and make sure patient gets an appt for 6 weeks for medication check Thanks Dr. Chauncey Cruel

## 2018-08-02 NOTE — Telephone Encounter (Signed)
I informed pt of Dr. Leeanne Rio review of results and orders. Transferred pt to scheduler.

## 2018-09-08 ENCOUNTER — Other Ambulatory Visit: Payer: Self-pay | Admitting: Certified Nurse Midwife

## 2018-09-08 ENCOUNTER — Ambulatory Visit: Payer: Medicare Other | Admitting: Certified Nurse Midwife

## 2018-09-08 DIAGNOSIS — Z1231 Encounter for screening mammogram for malignant neoplasm of breast: Secondary | ICD-10-CM

## 2018-09-17 ENCOUNTER — Encounter: Payer: Self-pay | Admitting: Sports Medicine

## 2018-09-17 ENCOUNTER — Other Ambulatory Visit: Payer: Self-pay

## 2018-09-17 ENCOUNTER — Ambulatory Visit: Payer: Medicare Other | Admitting: Sports Medicine

## 2018-09-17 VITALS — Temp 96.6°F

## 2018-09-17 DIAGNOSIS — M79675 Pain in left toe(s): Secondary | ICD-10-CM | POA: Diagnosis not present

## 2018-09-17 DIAGNOSIS — B351 Tinea unguium: Secondary | ICD-10-CM | POA: Diagnosis not present

## 2018-09-17 DIAGNOSIS — M79674 Pain in right toe(s): Secondary | ICD-10-CM

## 2018-09-17 DIAGNOSIS — Z79899 Other long term (current) drug therapy: Secondary | ICD-10-CM

## 2018-09-17 NOTE — Progress Notes (Signed)
Subjective: Linda Peters is a 73 y.o. female patient seen today in office for med check on Lamisil for + mold, started 4/20. Denies nausea, vomiting, fever, chills or any symptoms. Patient has no other pedal complaints at this time.   There are no active problems to display for this patient.   Current Outpatient Medications on File Prior to Visit  Medication Sig Dispense Refill  . Calcium Carbonate (CALCIUM 600 PO) Take 1 tablet by mouth 2 (two) times daily.    . Multiple Vitamin (MULTIVITAMIN) tablet Take 1 tablet by mouth daily.    . NONFORMULARY OR COMPOUNDED ITEM [facility]:  Antifungal cream - Terbinafine 3%, Fluconazole 2%, Tea Tree Oil 5%, Urea 10%, Ibuprofen 2%, in DMSO #16ml suspension. Apply to affected toenails once daily. 30 each 11  . simvastatin (ZOCOR) 20 MG tablet Take 20 mg by mouth daily.    Marland Kitchen terbinafine (LAMISIL) 250 MG tablet Take 1 tablet (250 mg total) by mouth daily. 90 tablet 0  . vitamin D, CHOLECALCIFEROL, 400 UNITS tablet Take 400 Units by mouth daily.     No current facility-administered medications on file prior to visit.     Allergies  Allergen Reactions  . Penicillins Rash    Objective: Physical Exam  General: Well developed, nourished, no acute distress, awake, alert and oriented x 3  Vascular: Dorsalis pedis artery 1/4 bilateral, Posterior tibial artery 1/4 bilateral, skin temperature warm to warm proximal to distal bilateral lower extremities, no varicosities, pedal hair present bilateral.  Neurological: Gross sensation present via light touch bilateral.   Dermatological: Skin is warm, dry, and supple bilateral, Nails 1-10 are tender, short thick, and discolored with mild subungal debris with the bilateral hallux nails most involved and mild incurvation lateral borders without signs of infection, no webspace macerations present bilateral, no open lesions present bilateral, no callus/corns/hyperkeratotic tissue present bilateral. No signs  of infection bilateral.  Musculoskeletal: Asymptomatic hammertoe boney deformities noted bilateral. Muscular strength within normal limits without pain on range of motion. No pain with calf compression bilateral.   Fungal culture Mold  Assessment and Plan:  Problem List Items Addressed This Visit    None    Visit Diagnoses    Nail fungus    -  Primary   Toe pain, bilateral       Encounter for long-term (current) use of medications          -Examined patient -Discussed  Mold culture results -Continue with Lamisil -New set of LFTs ordered and advised patient to continue with PO lamisil will call if results are abnormal -Continue with topical antifungal  -Advised good hygiene habits -Patient to return in 6 weeks for follow up evaluation or sooner if symptoms worsen.  Landis Martins, DPM

## 2018-09-22 LAB — HEPATIC FUNCTION PANEL
ALT: 26 IU/L (ref 0–32)
AST: 30 IU/L (ref 0–40)
Albumin: 4.4 g/dL (ref 3.8–4.8)
Alkaline Phosphatase: 71 IU/L (ref 39–117)
Bilirubin Total: 0.4 mg/dL (ref 0.0–1.2)
Bilirubin, Direct: 0.11 mg/dL (ref 0.00–0.40)
Total Protein: 6.1 g/dL (ref 6.0–8.5)

## 2018-10-27 ENCOUNTER — Ambulatory Visit: Payer: Medicare Other

## 2018-10-29 ENCOUNTER — Ambulatory Visit: Payer: Medicare Other | Admitting: Sports Medicine

## 2018-11-12 ENCOUNTER — Other Ambulatory Visit: Payer: Self-pay

## 2018-11-12 NOTE — Progress Notes (Signed)
73 y.o. G43P1001 Married  Caucasian Fe here for annual exam. Menopausal no vaginal dryness or vaginal bleeding. Sees PCP for cholesterol management, labs and aex. All stable. Patient has noted some increase swelling in right foot, no injuries or prolonged standing recently.Supportive shoes. Denies increase salt intake, but very little consumption of water. Sees Dermatology every 6 months due to melanoma history, no changes. No other health issues today.  Patient's last menstrual period was 04/14/1988 (approximate).          Sexually active: Yes.    The current method of family planning is husband vasectomy.    Exercising: Yes.    walking Smoker:  no  Review of Systems  Constitutional: Negative.   HENT: Negative.   Eyes: Negative.   Respiratory: Negative.   Cardiovascular: Negative.   Gastrointestinal: Negative.   Genitourinary: Negative.   Musculoskeletal: Negative.   Skin: Negative.   Neurological: Negative.   Endo/Heme/Allergies: Negative.   Psychiatric/Behavioral: Negative.     Health Maintenance: Pap:  1990? History of Abnormal Pap: no MMG:  09-03-17 category c density birads 1:neg Self Breast exams: no Colonoscopy:  2016 polyp f/u 3yr BMD:   2018 TDaP:  2013 Shingles: 2011 Pneumonia: 2016 Hep C and HIV: hep c neg 2017 Labs: PCP   reports that she has never smoked. She has never used smokeless tobacco. She reports that she does not drink alcohol or use drugs.  Past Medical History:  Diagnosis Date  . [place]   melan[place], face  . Hyperlipidemia     Past Surgical History:  Procedure Laterality Date  . ABDOMINAL HYSTERECTOMY  1990   secondary to endometrial hyperplasia & fibroids, ovaries remain  . KIDNEY SURGERY Right 1977  . MELANOMA EXCISION Right 11/2012   right side of face    Current Outpatient Medications  Medication Sig Dispense Refill  . Calcium Carbonate (CALCIUM 600 PO) Take 1 tablet by mouth 2 (two) times daily.    . Multiple Vitamin  (MULTIVITAMIN) tablet Take 1 tablet by mouth daily.    . NONFORMULARY OR COMPOUNDED ITEM [facility]:  Antifungal cream - Terbinafine 3%, Fluconazole 2%, Tea Tree Oil 5%, Urea 10%, Ibuprofen 2%, in DMSO #331msuspension. Apply to affected toenails once daily. 30 each 11  . simvastatin (ZOCOR) 20 MG tablet Take 20 mg by mouth daily.    . Marland Kitchenerbinafine (LAMISIL) 250 MG tablet Take 1 tablet (250 mg total) by mouth daily. 90 tablet 0  . vitamin D, CHOLECALCIFEROL, 400 UNITS tablet Take 400 Units by mouth daily.     No current facility-administered medications for this visit.     Family History  Problem Relation Age of Onset  . Dementia Mother   . Hyperlipidemia Sister   . Breast cancer Sister [id number]     .left lumpectomy with radiaton, BRCA neg.  . Marland Kitcheneart failure Maternal Grandmother   . Hyperlipidemia Sister   . Pancreatic cancer Cousin 80       surgrey 2017 - still living    ROS:  Pertinent items are noted in HPI.  Otherwise, a comprehensive ROS was negative.  Exam:   LMP 04/14/1988 (Approximate)    Ht Readings from Last 3 Encounters:  06/17/18 '5\' 1"'  (1.549 m)  09/03/17 5' 1.75" (1.568 m)  07/22/16 5' 1.75" (1.568 m)    General appearance: alert, cooperative and appears stated age Head: Normocephalic, without obvious abnormality, atraumatic Neck: no adenopathy, supple, symmetrical, trachea midline and thyroid normal to inspection and palpation Lungs:  clear to auscultation bilaterally Breasts: normal appearance, no masses or tenderness, No nipple retraction or dimpling, No nipple discharge or bleeding, No axillary or supraclavicular adenopathy Heart: regular rate and rhythm Abdomen: soft, non-tender; no masses,  no organomegaly Extremities: extremities normal, atraumatic, no cyanosis or edema. Right foot slight edema noted, no pitting. Pulses equal and strong on all areas of legs Skin: Skin color, texture, turgor normal. No rashes or lesions Lymph nodes: Cervical,  supraclavicular, and axillary nodes normal. No abnormal inguinal nodes palpated Neurologic: Grossly normal   Pelvic: External genitalia:  no lesions, atrophic appearance              Urethra:  normal appearing urethra with no masses, tenderness or lesions              Bartholin's and Skene's: normal                 Vagina: normal appearing vagina with normal color and discharge, no lesions or dryness noted              Cervix: absent              Pap taken: No. Bimanual Exam:  Uterus:  uterus absent              Adnexa: no mass, fullness, tenderness and adnexa surgically absent               Rectovaginal: Confirms               Anus:  normal sphincter tone, no lesions  Chaperone present: yes  A:  Well Woman with normal exam  Menopausal no HRT. S/P TAH with ovaries retained  Cholesterol management with PCP.   Osteopenia BMD due   Minimal edema of right ankle, pulses equal and strong  Family history of Breast cancer sister age 25 BRCA negative  P:   Reviewed health and wellness pertinent to exam  Aware if vaginal dryness increases to advise, if having discomfort  Continue follow up with PCP as indicated  Patient aware and would like to do with mammogram. Order will be placed for BMD.  Discussed minimal edema noted. Discussed, avoiding prolonged standing, but more important to increase water intake. If continues needs to follow up with PCP.  Pap smear: no   counseled on breast self exam, mammography screening, feminine hygiene, menopause, osteoporosis, adequate intake of calcium and vitamin D, diet and exercise  return annually or prn  An After Visit Summary was printed and given to the patient.

## 2018-11-15 ENCOUNTER — Ambulatory Visit (INDEPENDENT_AMBULATORY_CARE_PROVIDER_SITE_OTHER): Payer: Medicare Other | Admitting: Certified Nurse Midwife

## 2018-11-15 ENCOUNTER — Encounter: Payer: Self-pay | Admitting: Certified Nurse Midwife

## 2018-11-15 ENCOUNTER — Other Ambulatory Visit: Payer: Self-pay

## 2018-11-15 VITALS — BP 118/70 | HR 70 | Temp 97.1°F | Resp 16 | Ht 61.75 in | Wt 147.0 lb

## 2018-11-15 DIAGNOSIS — N951 Menopausal and female climacteric states: Secondary | ICD-10-CM

## 2018-11-15 DIAGNOSIS — M8588 Other specified disorders of bone density and structure, other site: Secondary | ICD-10-CM | POA: Diagnosis not present

## 2018-11-15 DIAGNOSIS — Z01419 Encounter for gynecological examination (general) (routine) without abnormal findings: Secondary | ICD-10-CM | POA: Diagnosis not present

## 2018-11-19 ENCOUNTER — Other Ambulatory Visit: Payer: Self-pay | Admitting: Certified Nurse Midwife

## 2018-11-19 DIAGNOSIS — M8588 Other specified disorders of bone density and structure, other site: Secondary | ICD-10-CM

## 2018-11-19 DIAGNOSIS — N951 Menopausal and female climacteric states: Secondary | ICD-10-CM

## 2018-12-10 ENCOUNTER — Other Ambulatory Visit: Payer: Self-pay

## 2018-12-10 ENCOUNTER — Ambulatory Visit
Admission: RE | Admit: 2018-12-10 | Discharge: 2018-12-10 | Disposition: A | Discharge status: Home / Self Care | Payer: Medicare Other | Source: Ambulatory Visit | Attending: Certified Nurse Midwife | Admitting: Certified Nurse Midwife

## 2018-12-10 DIAGNOSIS — Z1231 Encounter for screening mammogram for malignant neoplasm of breast: Secondary | ICD-10-CM

## 2019-01-31 DIAGNOSIS — E782 Mixed hyperlipidemia: Secondary | ICD-10-CM | POA: Insufficient documentation

## 2019-01-31 DIAGNOSIS — Z0000 Encounter for general adult medical examination without abnormal findings: Secondary | ICD-10-CM | POA: Insufficient documentation

## 2019-01-31 DIAGNOSIS — L209 Atopic dermatitis, unspecified: Secondary | ICD-10-CM | POA: Insufficient documentation

## 2019-01-31 DIAGNOSIS — K219 Gastro-esophageal reflux disease without esophagitis: Secondary | ICD-10-CM | POA: Insufficient documentation

## 2019-02-03 ENCOUNTER — Ambulatory Visit
Admission: RE | Admit: 2019-02-03 | Discharge: 2019-02-03 | Disposition: A | Discharge status: Home / Self Care | Payer: Medicare Other | Source: Ambulatory Visit | Attending: Certified Nurse Midwife | Admitting: Certified Nurse Midwife

## 2019-02-03 ENCOUNTER — Other Ambulatory Visit: Payer: Self-pay

## 2019-02-03 DIAGNOSIS — M8588 Other specified disorders of bone density and structure, other site: Secondary | ICD-10-CM

## 2019-02-03 DIAGNOSIS — N951 Menopausal and female climacteric states: Secondary | ICD-10-CM

## 2019-04-15 DIAGNOSIS — C4491 Basal cell carcinoma of skin, unspecified: Secondary | ICD-10-CM

## 2019-04-15 HISTORY — DX: Basal cell carcinoma of skin, unspecified: C44.91

## 2019-06-10 ENCOUNTER — Ambulatory Visit: Payer: Medicare Other

## 2019-07-05 ENCOUNTER — Encounter: Payer: Self-pay | Admitting: Certified Nurse Midwife

## 2019-10-31 ENCOUNTER — Other Ambulatory Visit: Payer: Self-pay | Admitting: Obstetrics and Gynecology

## 2019-10-31 DIAGNOSIS — Z1231 Encounter for screening mammogram for malignant neoplasm of breast: Secondary | ICD-10-CM

## 2019-11-18 ENCOUNTER — Ambulatory Visit: Payer: Medicare Other | Admitting: Certified Nurse Midwife

## 2019-12-27 ENCOUNTER — Ambulatory Visit: Payer: Medicare Other

## 2020-01-02 ENCOUNTER — Other Ambulatory Visit: Payer: Self-pay

## 2020-01-02 ENCOUNTER — Ambulatory Visit
Admission: RE | Admit: 2020-01-02 | Discharge: 2020-01-02 | Disposition: A | Discharge status: Home / Self Care | Payer: Medicare PPO | Source: Ambulatory Visit | Attending: Obstetrics and Gynecology | Admitting: Obstetrics and Gynecology

## 2020-01-02 DIAGNOSIS — Z1231 Encounter for screening mammogram for malignant neoplasm of breast: Secondary | ICD-10-CM

## 2020-03-28 ENCOUNTER — Ambulatory Visit: Payer: Medicare PPO | Admitting: Obstetrics and Gynecology

## 2020-05-28 ENCOUNTER — Encounter: Payer: Self-pay | Admitting: Gastroenterology

## 2020-08-06 NOTE — Progress Notes (Signed)
75 y.o. G52P1001 Married Caucasian female here for annual exam.    Labs done with PCP at [place] 04/2019--see [facility].  Patient having swelling in ankles. Advised patient may need to see PCP.  Sees her dermatologist every 2 years for skin check.   One grandson, graduating at [employer]  Received her Covid booster #1.   PCP: Janace Litten, MD  Patient's last menstrual period was 04/14/1988 (approximate).           Sexually active: No.  The current method of family planning is vasectomy.    Exercising: No.  The patient does not participate in regular exercise at present. Smoker:  no  Health Maintenance: Pap: 1990 normal History of abnormal Pap:  no MMG: 01-02-20 3D/Neg/BiRads1 Colonoscopy:  2016 polyp;next 5 years--knows she needs to schedule.    BMD: 02-03-19  Result :Osteopenia TDaP:  2013 Gardasil:   no HIV: no Hep C: 2017 Neg Screening Labs:   PCP.    reports that she has never smoked. She has never used smokeless tobacco. She reports that she does not drink alcohol and does not use drugs.  Past Medical History:  Diagnosis Date  . Basal cell carcinoma 2021   upper lip  . Cancer ([place]) 11/2012   melanoma, face  . Hyperlipidemia     Past Surgical History:  Procedure Laterality Date  . ABDOMINAL HYSTERECTOMY  1990   secondary to endometrial hyperplasia & fibroids, ovaries remain  . KIDNEY SURGERY Right 1977  . MELANOMA EXCISION Right 11/2012   right side of face    Current Outpatient Medications  Medication Sig Dispense Refill  . Calcium Carbonate (CALCIUM 600 PO) Take 1 tablet by mouth 2 (two) times daily.    . Emollient (CETAPHIL) cream Apply topically.    . Multiple Vitamin (MULTIVITAMIN) tablet Take 1 tablet by mouth daily.    Marland Kitchen omeprazole (PRILOSEC) 20 MG capsule Take 1 capsule by mouth daily.    . simvastatin (ZOCOR) 20 MG tablet Take 20 mg by mouth daily.    . vitamin D, CHOLECALCIFEROL, 400 UNITS tablet Take 400 Units by mouth daily.     No current  facility-administered medications for this visit.    Family History  Problem Relation Age of Onset  . Dementia Mother   . Hyperlipidemia Sister   . Breast cancer Sister 41       .left lumpectomy with radiaton, BRCA neg.  Marland Kitchen Heart failure Maternal Grandmother   . Hyperlipidemia Sister   . Pancreatic cancer Cousin 80       surgrey 2017 - still living    Review of Systems  Constitutional:       Ankles swelling  All other systems reviewed and are negative.   Exam:   BP 122/76   Pulse 73   Ht 5' 1" (1.549 m)   Wt 153 lb (69.4 kg)   LMP 04/14/1988 (Approximate)   SpO2 99%   BMI 28.91 kg/m     General appearance: alert, cooperative and appears stated age Head: normocephalic, without obvious abnormality, atraumatic Neck: no adenopathy, supple, symmetrical, trachea midline and thyroid normal to inspection and palpation Lungs: clear to auscultation bilaterally Breasts: normal appearance, no masses or tenderness, No nipple retraction or dimpling, No nipple discharge or bleeding, No axillary adenopathy Heart: regular rate and rhythm Abdomen: soft, non-tender; no masses, no organomegaly Extremities: extremities normal, atraumatic, right ankle edema.  Skin: skin color, texture, turgor normal. No rashes or lesions Lymph nodes: cervical, supraclavicular, and axillary nodes normal.  Neurologic: grossly normal  Pelvic: External genitalia:  no lesions              No abnormal inguinal nodes palpated.              Urethra:  normal appearing urethra with no masses, tenderness or lesions              Bartholins and Skenes: normal                 Vagina: normal appearing vagina with normal color and discharge, no lesions              Cervix: absent              Pap taken: No. Bimanual Exam:  Uterus:  absent              Adnexa: no mass, fullness, tenderness              Rectal exam: Yes.  .  Confirms.              Anus:  normal sphincter tone, no lesions  Chaperone was present for  exam.  Assessment:   Well woman visit with normal exam. Status post TAH. Ovaries remain.  Hx melanoma. Osteopenia. Mild. FH breast cancer.  Sister.  BRCA negative.  Right ankle edema.   Plan: Mammogram screening discussed. Self breast awareness reviewed. Pap and HR HPV as above. Guidelines for Calcium, Vitamin D, regular exercise program including cardiovascular and weight bearing exercise. BMD this fall.  She will schedule her colonoscopy.  She will follow up with her PCP for her ankle swelling. FU in 2 years for routine visit and prn.   30 in  total time was spent for this patient encounter, including preparation, face-to-face counseling with the patient, coordination of care, and documentation of the encounter.

## 2020-08-07 ENCOUNTER — Other Ambulatory Visit: Payer: Self-pay

## 2020-08-07 ENCOUNTER — Encounter: Payer: Self-pay | Admitting: Obstetrics and Gynecology

## 2020-08-07 ENCOUNTER — Ambulatory Visit: Payer: Medicare PPO | Admitting: Obstetrics and Gynecology

## 2020-08-07 VITALS — BP 122/76 | HR 73 | Ht 61.0 in | Wt 153.0 lb

## 2020-08-07 DIAGNOSIS — Z01419 Encounter for gynecological examination (general) (routine) without abnormal findings: Secondary | ICD-10-CM

## 2020-08-07 DIAGNOSIS — M8580 Other specified disorders of bone density and structure, unspecified site: Secondary | ICD-10-CM

## 2020-08-07 DIAGNOSIS — Z1239 Encounter for other screening for malignant neoplasm of breast: Secondary | ICD-10-CM

## 2020-08-07 DIAGNOSIS — Z780 Asymptomatic menopausal state: Secondary | ICD-10-CM | POA: Diagnosis not present

## 2020-08-07 NOTE — Patient Instructions (Signed)

## 2020-08-21 ENCOUNTER — Encounter: Payer: Self-pay | Admitting: Gastroenterology

## 2020-08-31 ENCOUNTER — Other Ambulatory Visit: Payer: Self-pay | Admitting: Obstetrics and Gynecology

## 2020-08-31 DIAGNOSIS — Z1231 Encounter for screening mammogram for malignant neoplasm of breast: Secondary | ICD-10-CM

## 2020-10-29 ENCOUNTER — Encounter: Payer: Medicare PPO | Admitting: Gastroenterology

## 2020-11-12 ENCOUNTER — Other Ambulatory Visit: Payer: Self-pay

## 2020-11-12 ENCOUNTER — Ambulatory Visit (AMBULATORY_SURGERY_CENTER): Payer: Self-pay | Admitting: *Deleted

## 2020-11-12 VITALS — Ht 61.0 in | Wt 151.0 lb

## 2020-11-12 DIAGNOSIS — Z8371 Family history of colonic polyps: Secondary | ICD-10-CM

## 2020-11-12 MED ORDER — NA SULFATE-K SULFATE-MG SULF 17.5-3.13-1.6 GM/177ML PO SOLN: 1.0000 | Freq: Once | ORAL | 0 refills | Status: DC

## 2020-11-12 NOTE — Progress Notes (Signed)
No egg or soy allergy known to patient  No issues with past sedation with any surgeries or procedures Patient denies ever being told they had issues or difficulty with intubation  No FH of Malignant Hyperthermia No diet pills per patient No home 02 use per patient  No blood thinners per patient  Pt denies issues with constipation  No A fib or A flutter  EMMI video to pt or via [place] 19 guidelines implemented in [place] today with Pt and RN  Pt is fully vaccinated  for Covid  Pt requested Miralax prep   Due to the COVID-19 pandemic we are asking patients to follow certain guidelines.  Pt aware of COVID protocols and [facility] guidelines

## 2020-11-26 ENCOUNTER — Encounter: Payer: Self-pay | Admitting: Gastroenterology

## 2020-11-26 ENCOUNTER — Ambulatory Visit (AMBULATORY_SURGERY_CENTER): Payer: Medicare PPO | Admitting: Gastroenterology

## 2020-11-26 ENCOUNTER — Other Ambulatory Visit: Payer: Self-pay

## 2020-11-26 VITALS — BP 107/50 | HR 59 | Temp 96.3°F | Resp 22 | Ht 61.0 in | Wt 151.0 lb

## 2020-11-26 DIAGNOSIS — Z8371 Family history of colonic polyps: Secondary | ICD-10-CM

## 2020-11-26 DIAGNOSIS — Z1211 Encounter for screening for malignant neoplasm of colon: Secondary | ICD-10-CM | POA: Diagnosis not present

## 2020-11-26 MED ORDER — SODIUM CHLORIDE 0.9 % IV SOLN: 500.0000 mL | Freq: Once | INTRAVENOUS | Status: DC

## 2020-11-26 NOTE — Progress Notes (Signed)
[facility] History and Physical   Primary Care Physician:  Myrlene Broker, MD   Reason for Procedure:   FH colon polyops  Plan:    colon     HPI: Linda Peters is a 75 y.o. female    Past Medical History:  Diagnosis Date   Basal cell carcinoma 2021   upper lip   Cancer ([place]) 11/2012   melanoma, face   GERD (gastroesophageal reflux disease)    Hyperlipidemia     Past Surgical History:  Procedure Laterality Date   ABDOMINAL HYSTERECTOMY  04/14/1988   secondary to endometrial hyperplasia & fibroids, ovaries remain   COLONOSCOPY     KIDNEY SURGERY Right 04/15/1975   MELANOMA EXCISION Right 11/12/2012   right side of face    Prior to Admission medications   Medication Sig Start Date End Date Taking? Authorizing Provider  Emollient (CETAPHIL) cream Apply topically. 02/06/20  Yes [provider]  Calcium Carbonate (CALCIUM 600 PO) Take 1 tablet by mouth 2 (two) times daily.    [provider]  furosemide (LASIX) 20 MG tablet Take by mouth. 08/29/20   [provider]  Multiple Vitamin (MULTIVITAMIN) tablet Take 1 tablet by mouth daily.    [provider]  omeprazole (PRILOSEC) 20 MG capsule Take 1 capsule by mouth daily. 02/06/20   [provider]  simvastatin (ZOCOR) 20 MG tablet Take 20 mg by mouth daily.    [provider]  vitamin D, CHOLECALCIFEROL, 400 UNITS tablet Take 400 Units by mouth daily.    [provider]    Current Outpatient Medications  Medication Sig Dispense Refill   Emollient (CETAPHIL) cream Apply topically.     Calcium Carbonate (CALCIUM 600 PO) Take 1 tablet by mouth 2 (two) times daily.     furosemide (LASIX) 20 MG tablet Take by mouth.     Multiple Vitamin (MULTIVITAMIN) tablet Take 1 tablet by mouth daily.     omeprazole (PRILOSEC) 20 MG capsule Take 1 capsule by mouth daily.     simvastatin (ZOCOR) 20 MG tablet Take 20 mg by mouth daily.     vitamin D,  CHOLECALCIFEROL, 400 UNITS tablet Take 400 Units by mouth daily.     Current Facility-Administered Medications  Medication Dose Route Frequency Provider Last Rate Last Admin   0.9 %  sodium chloride infusion  500 mL Intravenous Once Jackquline Denmark, MD        Allergies as of 11/26/2020 - Review Complete 11/26/2020  Allergen Reaction Noted   Penicillins Rash 07/11/2014    Family History  Problem Relation Age of Onset   Colon polyps Mother    Dementia Mother    Hyperlipidemia Sister    Breast cancer Sister 19       .left lumpectomy with radiaton, BRCA neg.   Hyperlipidemia Sister    Heart failure Maternal Grandmother    Pancreatic cancer Cousin 80       surgrey 2017 - still living   Colon cancer Neg Hx    Esophageal cancer Neg Hx    Rectal cancer Neg Hx    Stomach cancer Neg Hx     Social History   Socioeconomic History   Marital status: Married    Spouse name: Not on file   Number of children: 1   Years of education: Not on file   Highest education level: Not on file  Occupational History   Not on file  Tobacco Use   Smoking status: Never  Smokeless tobacco: Never  Vaping Use   Vaping Use: Never used  Substance and Sexual Activity   Alcohol use: No   Drug use: No   Sexual activity: Not Currently    Partners: Male    Birth control/protection: Post-menopausal, Other-see comments    Comment: husband vasectomy  Other Topics Concern   Not on file  Social History Narrative   Not on file   Social Determinants of Health   Financial Resource Strain: Not on file  Food Insecurity: Not on file  Transportation Needs: Not on file  Physical Activity: Not on file  Stress: Not on file  Social Connections: Not on file  Intimate Partner Violence: Not on file    Review of Systems: Positive for no new All other review of systems negative except as mentioned in the HPI.  Physical Exam: Vital signs in last 24 hours: '@VSRANGES' @   General:   Alert,  Well-developed,  well-nourished, pleasant and cooperative in NAD Lungs:  Clear throughout to auscultation.   Heart:  Regular rate and rhythm; no murmurs, clicks, rubs,  or gallops. Abdomen:  Soft, nontender and nondistended. Normal bowel sounds.   Neuro/Psych:  Alert and cooperative. Normal mood and affect. A and O x 3    No significant changes were identified.  The patient continues to be an appropriate candidate for the planned procedure and anesthesia.   Carmell Austria, MD. [facility] 11/26/2020 8:36 AM@

## 2020-11-26 NOTE — Patient Instructions (Signed)
   HANDOUTS ON DIVERTICULOSIS & HEMORRHOIDS GIVEN TO YOU TODAY  YOU HAD AN ENDOSCOPIC PROCEDURE TODAY AT [facility]:   Refer to the procedure report that was given to you for any specific questions about what was found during the examination.  If the procedure report does not answer your questions, please call your gastroenterologist to clarify.  If you requested that your care partner not be given the details of your procedure findings, then the procedure report has been included in a sealed envelope for you to review at your convenience later.  YOU SHOULD EXPECT: Some feelings of bloating in the abdomen. Passage of more gas than usual.  Walking can help get rid of the air that was put into your GI tract during the procedure and reduce the bloating. If you had a lower endoscopy (such as a colonoscopy or flexible sigmoidoscopy) you may notice spotting of blood in your stool or on the toilet paper. If you underwent a bowel prep for your procedure, you may not have a normal bowel movement for a few days.  Please Note:  You might notice some irritation and congestion in your nose or some drainage.  This is from the oxygen used during your procedure.  There is no need for concern and it should clear up in a day or so.  SYMPTOMS TO REPORT IMMEDIATELY:  Following lower endoscopy (colonoscopy or flexible sigmoidoscopy):  Excessive amounts of blood in the stool  Significant tenderness or worsening of abdominal pains  Swelling of the abdomen that is new, acute  Fever of 100F or higher   For urgent or emergent issues, a gastroenterologist can be reached at any hour by calling [phone]. Do not use MyChart messaging for urgent concerns.    DIET:  We do recommend a small meal at first, but then you may proceed to your regular diet.  Drink plenty of fluids but you should avoid alcoholic beverages for 24 hours.  ACTIVITY:  You should plan to take it easy for the rest of today and  you should NOT DRIVE or use heavy machinery until tomorrow (because of the sedation medicines used during the test).    FOLLOW UP: Our staff will call the number listed on your records 48-72 hours following your procedure to check on you and address any questions or concerns that you may have regarding the information given to you following your procedure. If we do not reach you, we will leave a message.  We will attempt to reach you two times.  During this call, we will ask if you have developed any symptoms of COVID 19. If you develop any symptoms (ie: fever, flu-like symptoms, shortness of breath, cough etc.) before then, please call [phone].  If you test positive for Covid 19 in the 2 weeks post procedure, please call and report this information to [name].    If any biopsies were taken you will be contacted by phone or by letter within the next 1-3 weeks.  Please call us at [phone] if you have not heard about the biopsies in 3 weeks.    SIGNATURES/CONFIDENTIALITY: You and/or your care partner have signed paperwork which will be entered into your electronic medical record.  These signatures attest to the fact that that the information above on your After Visit Summary has been reviewed and is understood.  Full responsibility of the confidentiality of this discharge information lies with you and/or your care-partner.

## 2020-11-26 NOTE — Op Note (Signed)
[facility] Patient Name: Linda Peters Procedure Date: 11/26/2020 8:34 AM MRN: [id number] Endoscopist: Jackquline Denmark , MD Age: 75 Referring MD:  Date of Birth: [birth year] Gender: Female Account #: [id number] Procedure:                Colonoscopy Indications:              Screening for colorectal malignant neoplasm. FH of                            colon polyps. Medicines:                Monitored Anesthesia Care Procedure:                Pre-Anesthesia Assessment:                           - Prior to the procedure, a History and Physical                            was performed, and patient medications and                            allergies were reviewed. The patient's tolerance of                            previous anesthesia was also reviewed. The risks                            and benefits of the procedure and the sedation                            options and risks were discussed with the patient.                            All questions were answered, and informed consent                            was obtained. Prior Anticoagulants: The patient has                            taken no previous anticoagulant or antiplatelet                            agents. ASA Grade Assessment: II - A patient with                            mild systemic disease. After reviewing the risks                            and benefits, the patient was deemed in                            satisfactory condition to undergo the procedure.  After obtaining informed consent, the colonoscope                            was passed under direct vision. Throughout the                            procedure, the patient's blood pressure, pulse, and                            oxygen saturations were monitored continuously. The                            Olympus [id number]) Colonoscope was                            introduced through the anus and advanced to the the                             cecum, identified by appendiceal orifice and                            ileocecal valve. Thereafter, 2 cm of TI was                            intubated. The colonoscopy was performed without                            difficulty. The patient tolerated the procedure                            well. The quality of the bowel preparation was                            good. The terminal ileum, ileocecal valve,                            appendiceal orifice, and rectum were photographed. Scope In: 8:41:09 AM Scope Out: 8:57:27 AM Scope Withdrawal Time: 0 hours 9 minutes 46 seconds  Total Procedure Duration: 0 hours 16 minutes 18 seconds  Findings:                 A few rare small-mouthed diverticula were found in                            the sigmoid colon.                           Non-bleeding external and internal hemorrhoids were                            found during retroflexion. The hemorrhoids were                            small.  The exam was otherwise without abnormality on                            direct and retroflexion views. Complications:            No immediate complications. Estimated Blood Loss:     Estimated blood loss: none. Impression:               - Very minimal sigmoid diverticulosis.                           - Non-bleeding external and internal hemorrhoids.                           - The examination was otherwise normal on direct                            and retroflexion views.                           - No specimens collected. Recommendation:           - Patient has a contact number available for                            emergencies. The signs and symptoms of potential                            delayed complications were discussed with the                            patient. Return to normal activities tomorrow.                            Written discharge instructions were provided to the                             patient.                           - Resume previous diet.                           - Continue present medications.                           - Repeat colonoscopy is not recommended for                            screening purposes. Hence, repeat colonoscopy only                            if with any new problems or clinically indicated.                           - The findings and recommendations were discussed  with the patient's family. Jackquline Denmark, MD 11/26/2020 9:01:36 AM This report has been signed electronically.

## 2020-11-26 NOTE — Progress Notes (Signed)
To PACU, VSS. Report to [email or web address] 

## 2020-11-28 ENCOUNTER — Telehealth: Payer: Self-pay

## 2020-11-28 NOTE — Telephone Encounter (Signed)
  Follow up Call-  Call back number 11/26/2020  Post procedure Call Back phone  # [phone]  Permission to leave phone message Yes  Some recent data might be hidden     Patient questions:  Do you have a fever, pain , or abdominal swelling? No. Pain Score  0 *  Have you tolerated food without any problems? Yes.    Have you been able to return to your normal activities? Yes.    Do you have any questions about your discharge instructions: Diet   No. Medications  No. Follow up visit  No.  Do you have questions or concerns about your Care? No.  Actions: * If pain score is 4 or above: No action needed, pain <4. Have you developed a fever since your procedure? no  2.   Have you had an respiratory symptoms (SOB or cough) since your procedure? no  3.   Have you tested positive for COVID 19 since your procedure no  4.   Have you had any family members/close contacts diagnosed with the COVID 19 since your procedure?  no   If yes to any of these questions please route to Joylene John, RN and Joella Prince, RN

## 2020-11-28 NOTE — Telephone Encounter (Signed)
  Follow up Call-  Call back number 11/26/2020  Post procedure Call Back phone  # [phone]  Permission to leave phone message Yes  Some recent data might be hidden     Patient questions:  Do you have a fever, pain , or abdominal swelling? No. Pain Score  0 *  Have you tolerated food without any problems? Yes.    Have you been able to return to your normal activities? Yes.    Do you have any questions about your discharge instructions: Diet   No. Medications  No. Follow up visit  No.  Do you have questions or concerns about your Care? No.  Actions: * If pain score is 4 or above: No action needed, pain <4.

## 2021-02-11 DIAGNOSIS — H6122 Impacted cerumen, left ear: Secondary | ICD-10-CM | POA: Insufficient documentation

## 2021-02-11 DIAGNOSIS — R5381 Other malaise: Secondary | ICD-10-CM | POA: Insufficient documentation

## 2021-02-11 DIAGNOSIS — R609 Edema, unspecified: Secondary | ICD-10-CM | POA: Insufficient documentation

## 2021-02-11 DIAGNOSIS — R600 Localized edema: Secondary | ICD-10-CM | POA: Insufficient documentation

## 2021-02-14 ENCOUNTER — Other Ambulatory Visit: Payer: Self-pay | Admitting: Obstetrics and Gynecology

## 2021-02-14 DIAGNOSIS — E2839 Other primary ovarian failure: Secondary | ICD-10-CM

## 2021-02-18 ENCOUNTER — Other Ambulatory Visit: Payer: Self-pay

## 2021-02-18 ENCOUNTER — Ambulatory Visit
Admission: RE | Admit: 2021-02-18 | Discharge: 2021-02-18 | Disposition: A | Discharge status: Home / Self Care | Payer: Medicare PPO | Source: Ambulatory Visit | Attending: Obstetrics and Gynecology | Admitting: Obstetrics and Gynecology

## 2021-02-18 DIAGNOSIS — E2839 Other primary ovarian failure: Secondary | ICD-10-CM

## 2021-02-18 DIAGNOSIS — Z1231 Encounter for screening mammogram for malignant neoplasm of breast: Secondary | ICD-10-CM

## 2021-02-19 ENCOUNTER — Other Ambulatory Visit: Payer: Self-pay | Admitting: Obstetrics and Gynecology

## 2021-02-19 DIAGNOSIS — R928 Other abnormal and inconclusive findings on diagnostic imaging of breast: Secondary | ICD-10-CM

## 2021-03-18 ENCOUNTER — Other Ambulatory Visit: Payer: Self-pay

## 2021-03-18 ENCOUNTER — Ambulatory Visit
Admission: RE | Admit: 2021-03-18 | Discharge: 2021-03-18 | Disposition: A | Discharge status: Home / Self Care | Payer: Medicare PPO | Source: Ambulatory Visit | Attending: Obstetrics and Gynecology | Admitting: Obstetrics and Gynecology

## 2021-03-18 DIAGNOSIS — R928 Other abnormal and inconclusive findings on diagnostic imaging of breast: Secondary | ICD-10-CM

## 2021-08-20 ENCOUNTER — Ambulatory Visit: Payer: Medicare PPO | Admitting: Sports Medicine

## 2021-08-20 ENCOUNTER — Other Ambulatory Visit: Payer: Self-pay | Admitting: *Deleted

## 2021-08-21 ENCOUNTER — Encounter: Payer: Self-pay | Admitting: Sports Medicine

## 2021-08-21 ENCOUNTER — Ambulatory Visit (INDEPENDENT_AMBULATORY_CARE_PROVIDER_SITE_OTHER): Payer: Medicare PPO

## 2021-08-21 ENCOUNTER — Ambulatory Visit: Payer: Medicare PPO | Admitting: Sports Medicine

## 2021-08-21 DIAGNOSIS — M79672 Pain in left foot: Secondary | ICD-10-CM

## 2021-08-21 DIAGNOSIS — G8929 Other chronic pain: Secondary | ICD-10-CM

## 2021-08-21 DIAGNOSIS — L603 Nail dystrophy: Secondary | ICD-10-CM | POA: Diagnosis not present

## 2021-08-21 DIAGNOSIS — M722 Plantar fascial fibromatosis: Secondary | ICD-10-CM | POA: Diagnosis not present

## 2021-08-21 MED ORDER — PREDNISONE 10 MG (21) PO TBPK: ORAL_TABLET | ORAL | 0 refills | Status: DC

## 2021-08-21 MED ORDER — TRIAMCINOLONE ACETONIDE 10 MG/ML IJ SUSP
10.0000 mg | Freq: Once | INTRAMUSCULAR | Status: AC
  Administered 2021-08-21: 10 mg

## 2021-08-21 NOTE — Progress Notes (Signed)
Subjective: ?Linda Peters is a 76 y.o. female patient presents to office with complaint of moderate heel pain on the left for the last 2 weeks getting worse but has been present longer patient is assisted by husband who helps to report this history but states that it is very sore and tender especially with the first few steps and hurts in the morning especially states that she has noticed some swelling and a cold sensation to the heel and has been trying to use a heating pad states that she also has noticed that her entire leg and ankle has swollen as well and states that it started after she had issues with her circulation and underwent a vein procedure.  Patient states that she asked her vein doctor if this could have caused her heel pain and he was not certain so they decided to follow-up.  Patient states that she also has been using occasionally some Kerasal to the left big toenail and is wondering if it is fungus.  Patient denies any other pedal complaints at this time. ? ?Patient Active Problem List  ? Diagnosis Date Noted  ? Impacted cerumen of left ear 02/11/2021  ? Malaise and fatigue 02/11/2021  ? Peripheral edema 02/11/2021  ? Atopic dermatitis 01/31/2019  ? Encounter for subsequent annual wellness visit (AWV) in Medicare patient 01/31/2019  ? GERD without esophagitis 01/31/2019  ? 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 ? ?Current Outpatient Medications on File Prior to Visit  ?Medication Sig Dispense Refill  ? Calcium Carbonate (CALCIUM 600 PO) Take 1 tablet by mouth 2 (two) times daily.    ? Emollient (CETAPHIL) cream Apply topically.    ? fluorouracil (EFUDEX) 5 % cream Apply 1 application. topically 2 (two) times daily.    ? furosemide (LASIX) 20 MG tablet Take by mouth.    ? Multiple Vitamin (MULTIVITAMIN) tablet Take 1 tablet by mouth daily.    ? mupirocin ointment (BACTROBAN) 2 % Apply 1 application. topically daily.    ? omeprazole (PRILOSEC) 20 MG capsule Take 1 capsule by mouth daily.    ? simvastatin  (ZOCOR) 40 MG tablet     ? triamcinolone cream (KENALOG) 0.1 % Apply 1 application. topically 2 (two) times daily.    ? vitamin D, CHOLECALCIFEROL, 400 UNITS tablet Take 400 Units by mouth daily.    ? ?No current facility-administered medications on file prior to visit.  ? ? ?Allergies  ?Allergen Reactions  ? Penicillins Rash  ? ? ?Objective: ?Physical Exam ?General: The patient is alert and oriented x3 in no acute distress. ? ?Dermatology: Skin is warm, dry and supple bilateral lower extremities. Nails 1-10 are normal with mild thickening noted distally at bilateral hallux and a small area of white discoloration of the nail likely from distal lifting secondary to micro pressure to the toenail versus fungus. There is no erythema, no eccymosis, no open lesions present. Integument is otherwise unremarkable. ? ?Vascular: Dorsalis Pedis pulse and Posterior Tibial pulse are 1/4 bilateral. Capillary fill time is immediate to all digits.  Varicosities noted bilateral diffuse trace edema noted to the left greater than right lower extremity.  No pain with palpation to calf and no acute signs of DVT.  Likely swelling is from patient having vein procedure ? ?Neurological: Grossly intact to light touch bilateral. ? ?Musculoskeletal: Tenderness to palpation at the medial calcaneal tubercale and through the insertion of the plantar fascia on the left foot.  No pain with compression of calcaneus bilateral. No pain with calf  compression bilateral. There is decreased Ankle joint range of motion bilateral. All other joints range of motion within normal limits bilateral. Strength 5/5 in all groups bilateral.  ? ?Gait: Unassisted, Antalgic avoid weight on left heel. ? ?Xray, left foot: ?Normal osseous mineralization. Joint spaces preserved. No fracture/dislocation/boney destruction. Calcaneal spur present with mild thickening of plantar fascia. No other soft tissue abnormalities or radiopaque foreign bodies.  ? ?Assessment and  Plan: ?Problem List Items Addressed This Visit   ?None ?Visit Diagnoses   ? ? Inflammatory pain of left heel    -  Primary  ? Relevant Orders  ? DG Foot Complete Left  ? Plantar fasciitis of left foot      ? Relevant Medications  ? triamcinolone acetonide (KENALOG) 10 MG/ML injection 10 mg (Completed) (Start on 08/21/2021 12:30 PM)  ? Left foot pain      ? Nail dystrophy      ? ?  ? ? ?-Complete examination performed.  ?-Discussed with patient nail dystrophy ?-Advised patient for her left greater than right big toenail to continue with Kerasal on a more consistent basis if after a year of use if it fails to improve or worsen may return to office for reevaluation for nail biopsy ?-Discussed treatment options with patient for left heel pain ?-Xrays reviewed ?-Discussed with patient in detail the condition of plantar fasciitis, how this occurs and general treatment options. Explained both conservative and surgical treatments.  ?-After oral consent and aseptic prep, injected a mixture containing 1 ml of 2%  ?plain lidocaine, 1 ml 0.5% plain marcaine, 0.5 ml of kenalog 10 and 0.5 ml of dexamethasone phosphate into left heel.  Post-injection care discussed with patient.  ?-Rx for prednisone to take as directed and advised patient to use Tylenol for additional pain relief. ?-Explained and dispensed to patient daily stretching exercises. ?-Recommend patient to ice affected area 1-2x daily. ?-Patient to return to office in 3 weeks for follow up or sooner if problems or questions arise. ? ?Landis Martins, DPM ? ?

## 2021-09-02 ENCOUNTER — Ambulatory Visit: Payer: Medicare PPO | Admitting: Podiatry

## 2021-09-11 ENCOUNTER — Encounter: Payer: Self-pay | Admitting: Sports Medicine

## 2021-09-11 ENCOUNTER — Ambulatory Visit: Payer: Medicare PPO | Admitting: Sports Medicine

## 2021-09-11 DIAGNOSIS — L603 Nail dystrophy: Secondary | ICD-10-CM

## 2021-09-11 DIAGNOSIS — M79672 Pain in left foot: Secondary | ICD-10-CM | POA: Diagnosis not present

## 2021-09-11 DIAGNOSIS — M722 Plantar fascial fibromatosis: Secondary | ICD-10-CM

## 2021-09-11 NOTE — Progress Notes (Signed)
Subjective: Linda Peters is a 76 y.o. female returns to office for follow up evaluation after Left heel injection for Planter fasciitis administered injection #1 3 weeks ago.  Patient reports that her heel is doing much better and the injection seem to help and she can walk much better now.  Patient also admits that she thinks she might need her right big toenail trimmed a little more little sore in the corners but denies any significant redness warmth swelling or drainage. 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     Emollient (CETAPHIL) cream Apply topically.     fluorouracil (EFUDEX) 5 % cream Apply 1 application. topically 2 (two) times daily.     furosemide (LASIX) 20 MG tablet Take by mouth.     Multiple Vitamin (MULTIVITAMIN) tablet Take 1 tablet by mouth daily.     mupirocin ointment (BACTROBAN) 2 % Apply 1 application. topically daily.     omeprazole (PRILOSEC) 20 MG capsule Take 1 capsule by mouth daily.     predniSONE (STERAPRED UNI-PAK 21 TAB) 10 MG (21) TBPK tablet Take as directed 21 tablet 0   simvastatin (ZOCOR) 40 MG tablet      triamcinolone cream (KENALOG) 0.1 % Apply 1 application. topically 2 (two) times daily.     vitamin D, CHOLECALCIFEROL, 400 UNITS tablet Take 400 Units by mouth daily.     No current facility-administered medications on file prior to visit.    Allergies  Allergen Reactions   Penicillins Rash    Objective:   General:  Alert and oriented x 3, in no acute  distress  Dermatology: Skin is warm, dry, and supple bilateral. Nails are well manicured bilateral hallux nails are mildly thickened with minimal incurvation noted at the right hallux lateral margin with no signs of infection.  Vascular: Dorsalis Pedis and Posterior Tibial pedal pulses are 1/4 bilateral. + hair growth noted bilateral. Capillary Fill Time is 3 seconds in all digits. No varicosities, No edema bilateral lower extremities.   Neurological: Sensation grossly intact to light touch bilateral.  Musculoskeletal: There is decreased tenderness to palpation at the medial calcaneal tubercale and through the insertion of the plantar fascia on the left foot, range of motion within normal limits.   Assessment and Plan: Problem List Items Addressed This Visit   None Visit Diagnoses     Inflammatory pain of left heel    -  Primary   Plantar fasciitis of left foot       Left foot pain       Nail dystrophy           -Complete examination performed.  -Discussed with patient in detail the condition of plantar fasciitis, how this  occurs related to the foot type of the patient and general treatment options. -No injection given at this visit since pain is markedly improved -Continue with stretching, icing, good supportive shoes, inserts daily; shoe list provided.  -Discussed long term care and reocurrence; will closely monitor; if fails to improve will consider other  treatment modalities.  -At no additional charge mechanically debrided right hallux nail removing any offending borders and a slant back fashion using a sterile nail nipper without incident -Patient to return to office as needed or sooner problems or issues arise.  Landis Martins, DPM

## 2021-09-11 NOTE — Patient Instructions (Signed)
Shoe List For tennis shoes recommend:  [place] New balance [place] HOKA Can be purchased at [employer] sports or [employer] arch fit Can be purchased at any major retailers  Vionic  SAS Can be purchased at [employer] or [employer]   For work shoes recommend: [employer] Work [employer] Can be purchased at a variety of places or [employer]   For casual shoes recommend: Oofos Can be purchased at [employer] sports or [employer]  Can be purchased at [employer] or [place] recommend: Power Steps Can be purchased in office/[facility] [place] Can be purchased at [employer] sports or [employer] Can be purchased at [place] recommend: Leisure centre manager at [employer] and [employer]

## 2021-12-09 ENCOUNTER — Ambulatory Visit: Payer: Medicare PPO | Admitting: Podiatry

## 2021-12-09 ENCOUNTER — Encounter: Payer: Self-pay | Admitting: Podiatry

## 2021-12-09 DIAGNOSIS — M722 Plantar fascial fibromatosis: Secondary | ICD-10-CM | POA: Diagnosis not present

## 2021-12-09 DIAGNOSIS — L603 Nail dystrophy: Secondary | ICD-10-CM

## 2021-12-09 MED ORDER — DEXAMETHASONE SODIUM PHOSPHATE 120 MG/30ML IJ SOLN
4.0000 mg | Freq: Once | INTRAMUSCULAR | Status: AC
  Administered 2021-12-09: 4 mg via INTRA_ARTICULAR

## 2021-12-09 NOTE — Progress Notes (Signed)
Subjective: Linda Peters is a 76 y.o. female returns to office for follow up evaluation after Left heel injection .Relates it has returned and wondering about another injection. .   Patient also admits that she thinks she might need her right big toenail trimmed a little more little sore in the corners but denies any significant redness warmth swelling or drainage. 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     Emollient (CETAPHIL) cream Apply topically.     fluorouracil (EFUDEX) 5 % cream Apply 1 application. topically 2 (two) times daily.     furosemide (LASIX) 20 MG tablet Take by mouth.     Multiple Vitamin (MULTIVITAMIN) tablet Take 1 tablet by mouth daily.     mupirocin ointment (BACTROBAN) 2 % Apply 1 application. topically daily.     omeprazole (PRILOSEC) 20 MG capsule Take 1 capsule by mouth daily.     predniSONE (STERAPRED UNI-PAK 21 TAB) 10 MG (21) TBPK tablet Take as directed 21 tablet 0   simvastatin (ZOCOR) 40 MG tablet      triamcinolone cream (KENALOG) 0.1 % Apply 1 application. topically 2 (two) times daily.     vitamin D, CHOLECALCIFEROL, 400 UNITS tablet Take 400 Units by mouth daily.     No current facility-administered medications on file prior to visit.    Allergies  Allergen Reactions   Penicillins Rash    Objective:   General:  Alert and oriented x 3, in no acute distress  Dermatology: Skin is warm, dry, and supple bilateral. Nails are well manicured bilateral hallux nails are mildly thickened  with minimal incurvation noted at the right hallux lateral margin with no signs of infection.  Vascular: Dorsalis Pedis and Posterior Tibial pedal pulses are 1/4 bilateral. + hair growth noted bilateral. Capillary Fill Time is 3 seconds in all digits. No varicosities, No edema bilateral lower extremities.   Neurological: Sensation grossly intact to light touch bilateral.  Musculoskeletal: There is decreased tenderness to palpation at the medial calcaneal tubercale and through the insertion of the plantar fascia on the left foot, range of motion within normal limits.   Assessment and Plan: Problem List Items Addressed This Visit   None    -Complete examination performed.  -Discussed with patient in detail the condition of plantar fasciitis, how this  occurs related to the foot type of the patient and general treatment options. -Injection offered today. Procedure below.  -Continue with stretching, icing, good supportive shoes, inserts daily; shoe list provided.  -Discussed long term care and reocurrence; will closely monitor; if fails to improve will consider other treatment modalities.  -At no additional charge mechanically debrided right hallux nail removing any offending borders and a slant back fashion using a sterile nail nipper without incident -Patient to return to office as needed or sooner problems or issues arise.   Lorenda Peck, DPM  Procedure: Injection Tendon/Ligament Discussed alternatives, risks, complications and verbal consent was obtained.  Location: Left plantar fascia . Skin Prep: Alcohol. Injectate: 1cc 0.5% marcaine plain, 1  cc dexamethasone.  Disposition: Patient tolerated procedure well. Injection site dressed with a band-aid.  Post-injection care was discussed and return precautions discussed.

## 2022-01-01 ENCOUNTER — Other Ambulatory Visit: Payer: Self-pay | Admitting: Obstetrics and Gynecology

## 2022-01-01 DIAGNOSIS — Z1231 Encounter for screening mammogram for malignant neoplasm of breast: Secondary | ICD-10-CM

## 2022-02-09 IMAGING — MG MM DIGITAL DIAGNOSTIC UNILAT*R* W/ TOMO W/ CAD
4 series · 4 of 12 positions shown · non-contrast
Comparison: Previous exam(s).

CLINICAL DATA: 75-year-old female for further evaluation of
possible RIGHT breast asymmetry on screening mammogram.

EXAM:
DIGITAL DIAGNOSTIC UNILATERAL RIGHT MAMMOGRAM WITH TOMOSYNTHESIS AND
CAD; ULTRASOUND RIGHT BREAST LIMITED
TECHNIQUE: Right digital diagnostic mammography and breast tomosynthesis was
performed. The images were evaluated with computer-aided detection.;
Targeted ultrasound examination of the right breast was performed

[R MLO synth-2D]
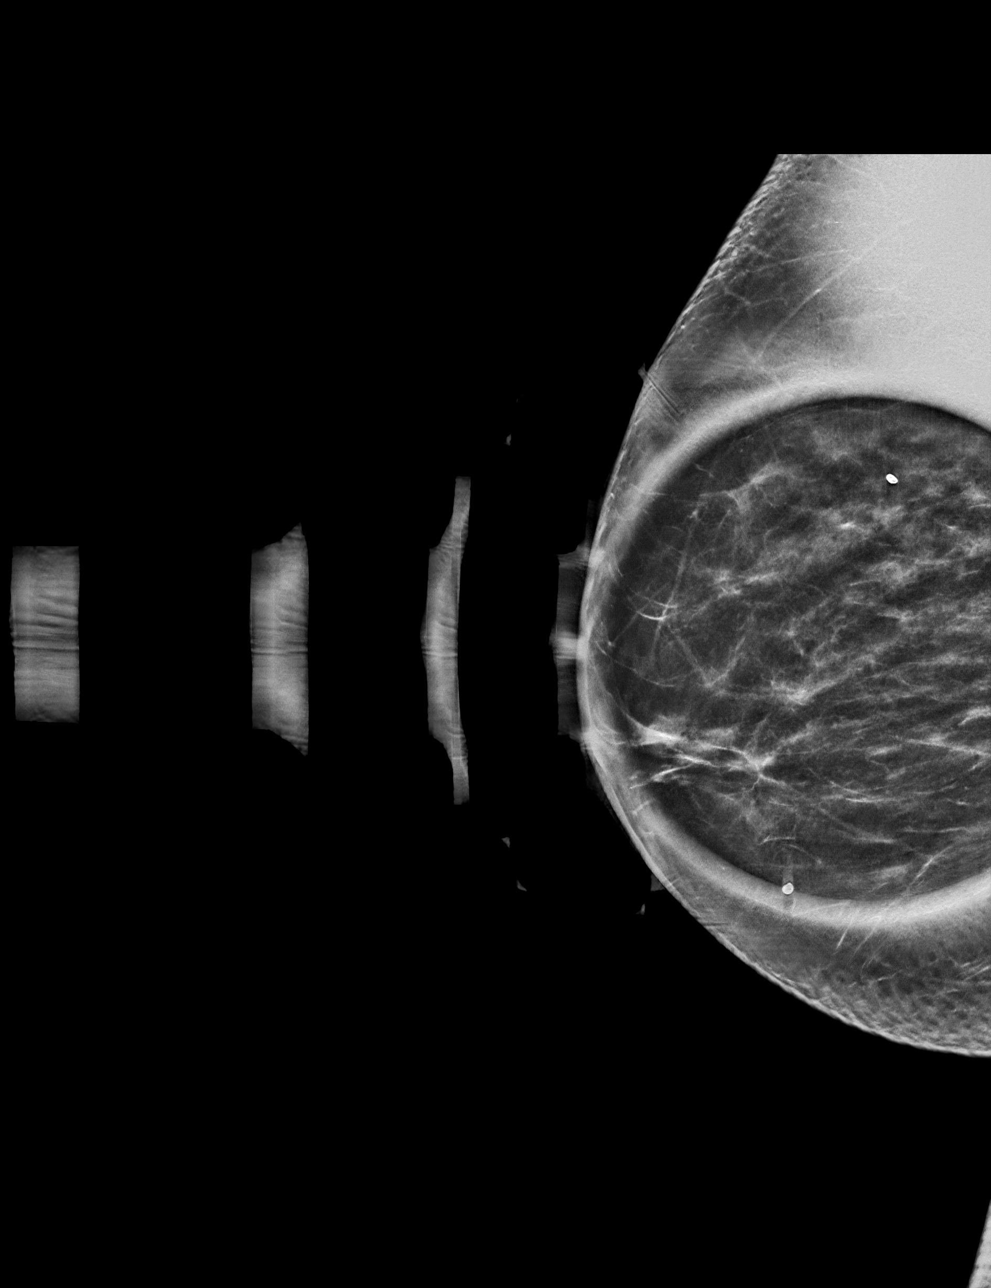

[R CC synth-2D]
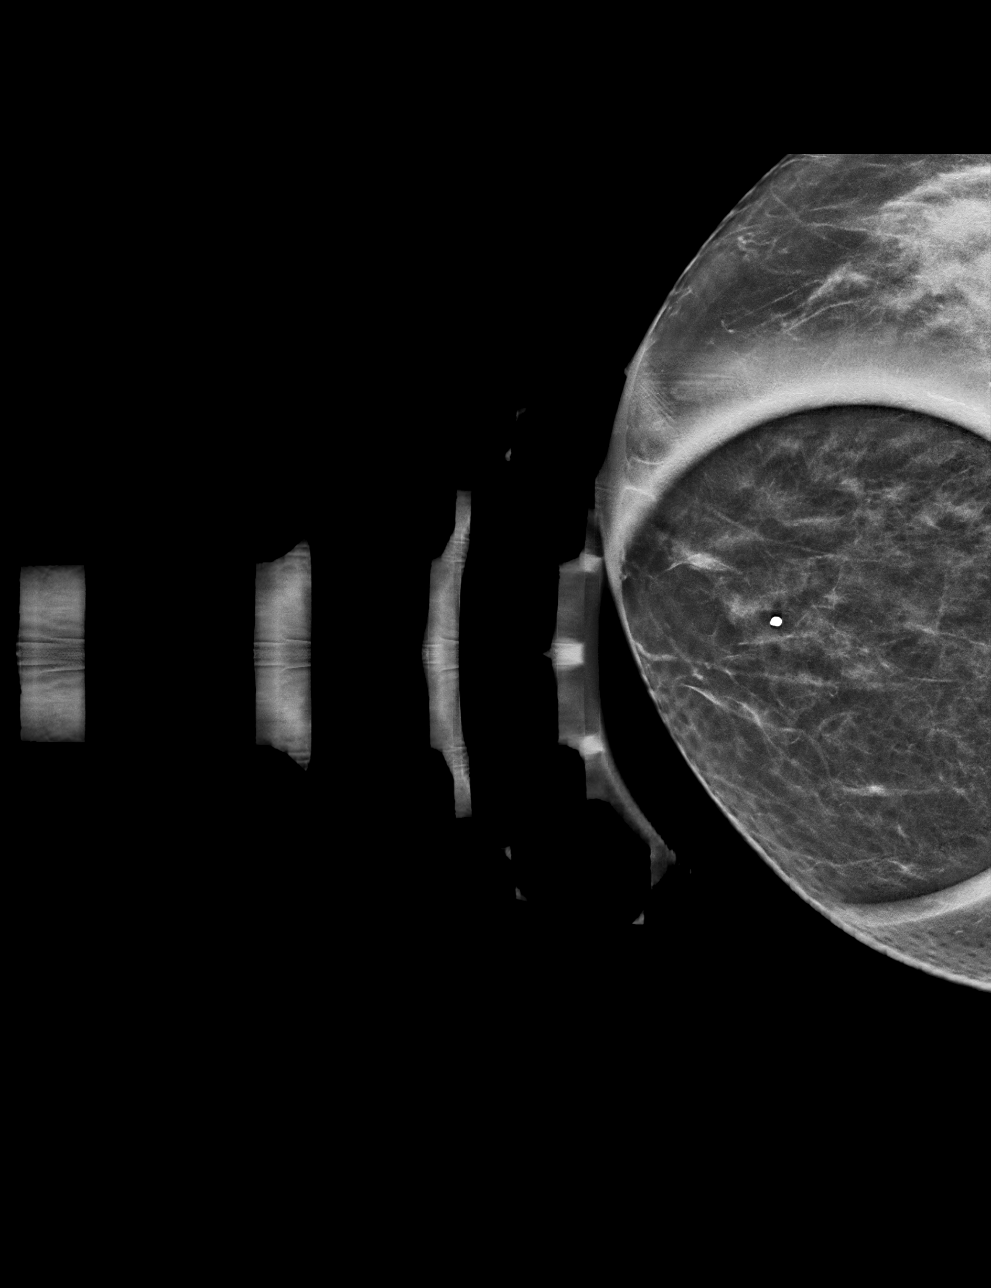

[R CC tomo · tomo slice 27/54.0]
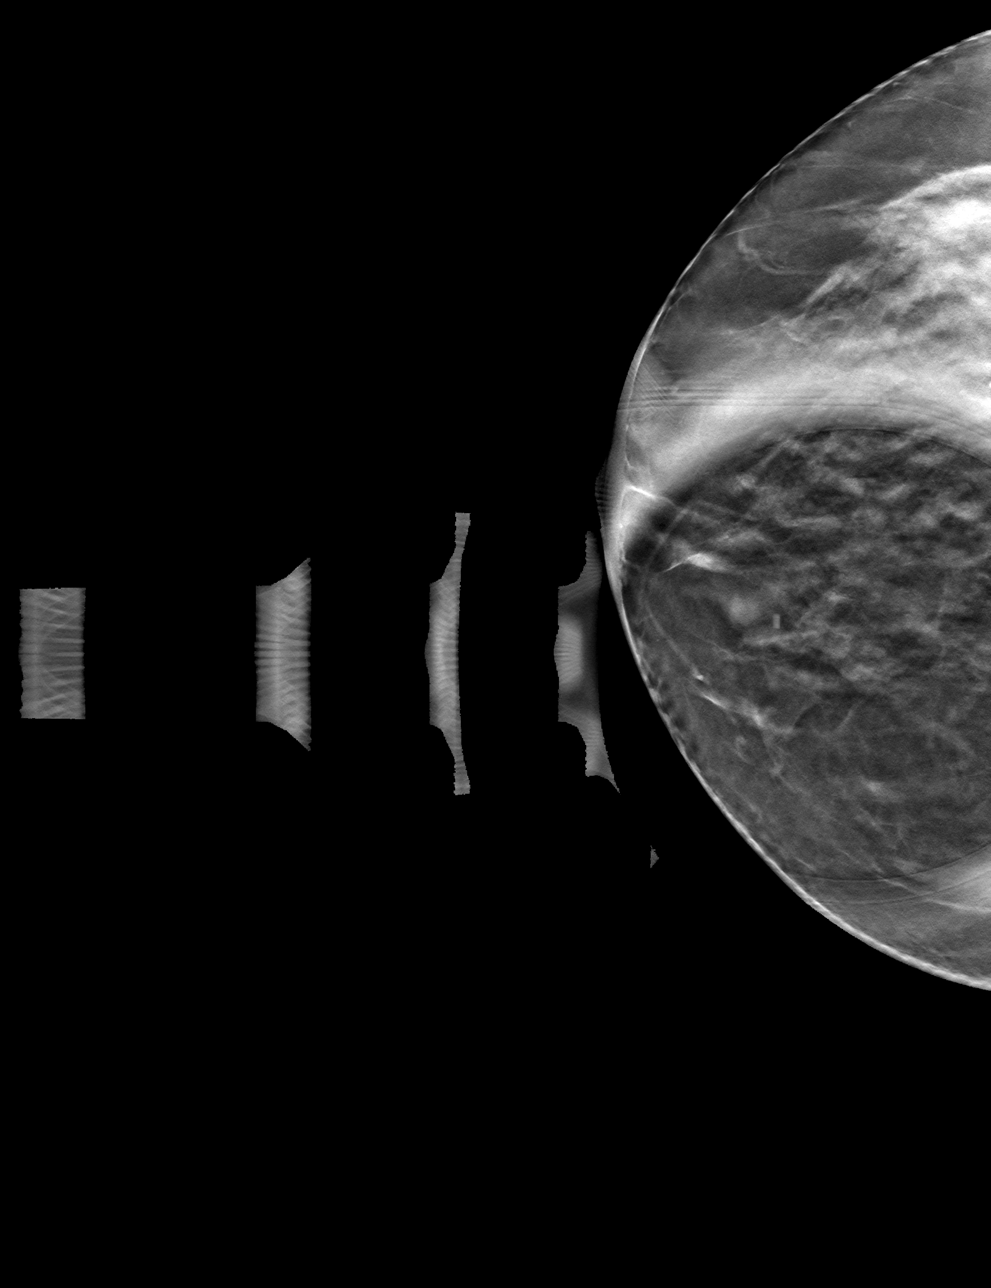

[R MLO tomo · tomo slice 30/59.0]
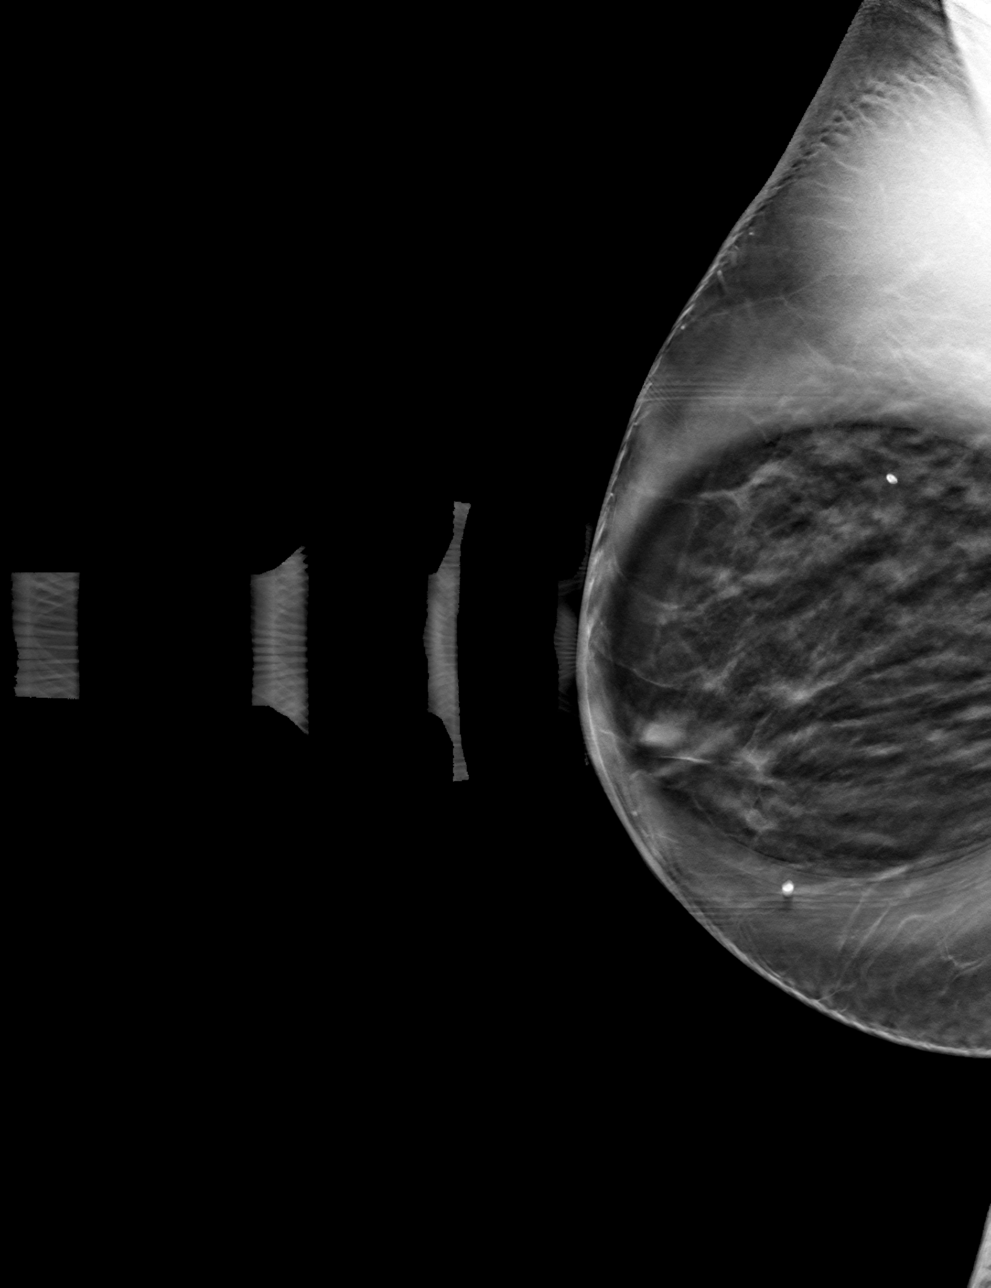

[4 of 12 positions shown; findings below may reference images not displayed]

ACR Breast Density Category c: The breast tissue is heterogeneously
dense, which may obscure small masses.
FINDINGS: 2D/3D spot compression views of the RIGHT breast demonstrate a
persistent circumscribed low-density mass within the INNER RIGHT
breast.

Targeted ultrasound is performed, showing a 0.3 x 0.3 x 0.3 cm
circumscribed oval near anechoic mass at the 3 o'clock position of
the RIGHT breast 2 cm from the nipple, compatible with a benign
complicated cyst. No suspicious sonographic findings are noted
within the INNER RIGHT breast.
IMPRESSION: Benign cyst in the INNER RIGHT breast, a good correlate to the
screening study finding.

RECOMMENDATION:
Bilateral screening mammogram in 1 year.

I have discussed the findings and recommendations with the patient.
If applicable, a reminder letter will be sent to the patient
regarding the next appointment.

BI-RADS CATEGORY  2: Benign.

## 2022-02-24 ENCOUNTER — Ambulatory Visit
Admission: RE | Admit: 2022-02-24 | Discharge: 2022-02-24 | Disposition: A | Discharge status: Home / Self Care | Payer: Medicare PPO | Source: Ambulatory Visit | Attending: Obstetrics and Gynecology | Admitting: Obstetrics and Gynecology

## 2022-02-24 DIAGNOSIS — Z1231 Encounter for screening mammogram for malignant neoplasm of breast: Secondary | ICD-10-CM

## 2022-04-14 DIAGNOSIS — M8580 Other specified disorders of bone density and structure, unspecified site: Secondary | ICD-10-CM

## 2022-04-14 HISTORY — DX: Other specified disorders of bone density and structure, unspecified site: M85.80

## 2022-08-06 NOTE — Progress Notes (Signed)
77 y.o. [id number] Married Caucasian female here for breast and pelvic exam  No gynecologic concerns.   Now wearing hearing aides.   PCP:   Keturah Barre, MD  Patient's last menstrual period was 04/14/1988 (approximate).           Sexually active: No.  The current method of family planning is status post hysterectomy.    Exercising: No.     Smoker:  no  Health Maintenance: Pap:  1990 History of abnormal Pap:  no MMG:  02/24/22 Breast Density Cat B, BI-RADS CAT 1 neg Colonoscopy:  11/26/20 BMD:   02/18/21  Result  osteopenia of spine.  Normal hip.  FRAX:  10.6%, 1.8%.  TDaP:  05/01/11 Gardasil:   no HIV: n/a Hep C: 2017 neg Screening Labs:  PCP   reports that she has never smoked. She has never used smokeless tobacco. She reports that she does not drink alcohol and does not use drugs.  Past Medical History:  Diagnosis Date   Basal cell carcinoma 2021   upper lip   Cancer (HCC) 11/2012   melanoma, face   GERD (gastroesophageal reflux disease)    Hyperlipidemia     Past Surgical History:  Procedure Laterality Date   ABDOMINAL HYSTERECTOMY  04/14/1988   secondary to endometrial hyperplasia & fibroids, ovaries remain   COLONOSCOPY     KIDNEY SURGERY Right 04/15/1975   MELANOMA EXCISION Right 11/12/2012   right side of face    Current Outpatient Medications  Medication Sig Dispense Refill   Calcium Carbonate (CALCIUM 600 PO) Take 1 tablet by mouth 2 (two) times daily.     Emollient (CETAPHIL) cream Apply topically.     fluorouracil (EFUDEX) 5 % cream Apply 1 application. topically 2 (two) times daily.     furosemide (LASIX) 20 MG tablet Take by mouth.     Multiple Vitamin (MULTIVITAMIN) tablet Take 1 tablet by mouth daily.     mupirocin ointment (BACTROBAN) 2 % Apply 1 application. topically daily.     omeprazole (PRILOSEC) 20 MG capsule Take 1 capsule by mouth daily.     simvastatin (ZOCOR) 40 MG tablet      triamcinolone cream (KENALOG) 0.1 % Apply 1 application.  topically 2 (two) times daily.     vitamin D, CHOLECALCIFEROL, 400 UNITS tablet Take 400 Units by mouth daily.     No current facility-administered medications for this visit.    Family History  Problem Relation Age of Onset   Colon polyps Mother    Dementia Mother    Hyperlipidemia Sister    Breast cancer Sister 60       .left lumpectomy with radiaton, BRCA neg.   Breast cancer Sister    Hyperlipidemia Sister    Heart failure Maternal Grandmother    Pancreatic cancer Cousin 51       surgrey 2017 - still living   Colon cancer Neg Hx    Esophageal cancer Neg Hx    Rectal cancer Neg Hx    Stomach cancer Neg Hx     Review of Systems  All other systems reviewed and are negative.   Exam:   BP 118/72 (BP Location: Left Arm, Patient Position: Sitting, Cuff Size: Normal)   Pulse (!) 103   Ht 5\' 1"  (1.549 m)   Wt 148 lb (67.1 kg)   LMP 04/14/1988 (Approximate)   SpO2 98%   BMI 27.96 kg/m     General appearance: alert, cooperative and appears stated age Head: normocephalic, without obvious  abnormality, atraumatic Neck: no adenopathy, supple, symmetrical, trachea midline and thyroid normal to inspection and palpation Lungs: clear to auscultation bilaterally Breasts: normal appearance, no masses or tenderness, No nipple retraction or dimpling, No nipple discharge or bleeding, No axillary adenopathy Heart: regular rate and rhythm Abdomen: soft, non-tender; no masses, no organomegaly Extremities: extremities normal, atraumatic, no cyanosis or edema Skin: skin color, texture, turgor normal. No rashes or lesions Lymph nodes: cervical, supraclavicular, and axillary nodes normal. Neurologic: grossly normal  Pelvic: External genitalia:  no lesions              No abnormal inguinal nodes palpated.              Urethra:  normal appearing urethra with no masses, tenderness or lesions              Bartholins and Skenes: normal                 Vagina: normal appearing vagina with normal  color and discharge, no lesions              Cervix: absent              Pap taken: no Bimanual Exam:  Uterus:  absent              Adnexa: no mass, fullness, tenderness              Rectal exam: yes.  Confirms.              Anus:  normal sphincter tone, no lesions  Chaperone was present for exam:  Warren Lacy, CMA  Assessment:   Well woman visit with gynecologic exam. Status post TAH. Ovaries remain.  Hx melanoma. Osteopenia. Mild. FH breast cancer.  Sister.  BRCA negative.   Plan: 3D mammogram screening discussed. Self breast awareness reviewed. Pap and HR HPV not indicated.  Guidelines for Calcium, Vitamin D, regular exercise program including cardiovascular and weight bearing exercise. BMD from 2022 reviewed including fracture risk calculated from FRAX model.  BMD ordered for this year.  Due in November.  FU in 2 years and prn.   10 min  total time was spent for this patient encounter, including preparation, face-to-face counseling with the patient, coordination of care, and documentation of the encounter in addition to doing breast and pelvic exam.   Osteopenia reviewed and BMD ordered.

## 2022-08-18 ENCOUNTER — Ambulatory Visit: Payer: Medicare PPO | Admitting: Obstetrics and Gynecology

## 2022-08-20 ENCOUNTER — Encounter: Payer: Self-pay | Admitting: Obstetrics and Gynecology

## 2022-08-20 ENCOUNTER — Ambulatory Visit (INDEPENDENT_AMBULATORY_CARE_PROVIDER_SITE_OTHER): Payer: Medicare PPO | Admitting: Obstetrics and Gynecology

## 2022-08-20 VITALS — BP 118/72 | HR 103 | Ht 61.0 in | Wt 148.0 lb

## 2022-08-20 DIAGNOSIS — Z01419 Encounter for gynecological examination (general) (routine) without abnormal findings: Secondary | ICD-10-CM | POA: Diagnosis not present

## 2022-08-20 DIAGNOSIS — M8588 Other specified disorders of bone density and structure, other site: Secondary | ICD-10-CM

## 2022-08-20 DIAGNOSIS — Z780 Asymptomatic menopausal state: Secondary | ICD-10-CM

## 2022-09-16 ENCOUNTER — Other Ambulatory Visit: Payer: Self-pay | Admitting: Obstetrics and Gynecology

## 2022-09-16 DIAGNOSIS — Z1231 Encounter for screening mammogram for malignant neoplasm of breast: Secondary | ICD-10-CM

## 2023-04-02 ENCOUNTER — Ambulatory Visit
Admission: RE | Admit: 2023-04-02 | Discharge: 2023-04-02 | Disposition: A | Discharge status: Home / Self Care | Payer: Medicare PPO | Source: Ambulatory Visit | Attending: Obstetrics and Gynecology | Admitting: Obstetrics and Gynecology

## 2023-04-02 DIAGNOSIS — M8588 Other specified disorders of bone density and structure, other site: Secondary | ICD-10-CM

## 2023-04-02 DIAGNOSIS — Z780 Asymptomatic menopausal state: Secondary | ICD-10-CM

## 2023-04-02 DIAGNOSIS — Z1231 Encounter for screening mammogram for malignant neoplasm of breast: Secondary | ICD-10-CM

## 2023-04-04 ENCOUNTER — Encounter: Payer: Self-pay | Admitting: Obstetrics and Gynecology

## 2023-08-19 ENCOUNTER — Other Ambulatory Visit: Payer: Self-pay | Admitting: Obstetrics and Gynecology

## 2023-08-19 DIAGNOSIS — Z1231 Encounter for screening mammogram for malignant neoplasm of breast: Secondary | ICD-10-CM

## 2024-04-04 ENCOUNTER — Inpatient Hospital Stay
Admission: RE | Admit: 2024-04-04 | Discharge: 2024-04-04 | Attending: Obstetrics and Gynecology | Admitting: Obstetrics and Gynecology

## 2024-04-04 DIAGNOSIS — Z1231 Encounter for screening mammogram for malignant neoplasm of breast: Secondary | ICD-10-CM

## 2024-04-10 ENCOUNTER — Ambulatory Visit: Payer: Self-pay | Admitting: Obstetrics and Gynecology
